# Patient Record
Sex: Male | Born: 1980 | Race: White | Hispanic: No | Marital: Married | Smoking: Current every day smoker
Health system: Southern US, Community
[De-identification: ages and names within clinical notes are randomized; demographics above are authoritative.]

## PROBLEM LIST (undated history)

## (undated) DIAGNOSIS — Z720 Tobacco use: Secondary | ICD-10-CM

## (undated) DIAGNOSIS — I10 Essential (primary) hypertension: Secondary | ICD-10-CM

## (undated) DIAGNOSIS — I2510 Atherosclerotic heart disease of native coronary artery without angina pectoris: Secondary | ICD-10-CM

## (undated) DIAGNOSIS — K219 Gastro-esophageal reflux disease without esophagitis: Secondary | ICD-10-CM

## (undated) DIAGNOSIS — E785 Hyperlipidemia, unspecified: Secondary | ICD-10-CM

## (undated) DIAGNOSIS — K5792 Diverticulitis of intestine, part unspecified, without perforation or abscess without bleeding: Secondary | ICD-10-CM

## (undated) HISTORY — DX: Hyperlipidemia, unspecified: E78.5

## (undated) HISTORY — DX: Tobacco use: Z72.0

## (undated) HISTORY — DX: Atherosclerotic heart disease of native coronary artery without angina pectoris: I25.10

---

## 2004-02-19 ENCOUNTER — Emergency Department: Payer: Self-pay | Admitting: Emergency Medicine

## 2004-05-22 ENCOUNTER — Emergency Department: Payer: Self-pay | Admitting: Emergency Medicine

## 2004-06-23 ENCOUNTER — Emergency Department: Payer: Self-pay | Admitting: Emergency Medicine

## 2006-09-15 ENCOUNTER — Emergency Department: Payer: Self-pay | Admitting: Emergency Medicine

## 2007-06-24 ENCOUNTER — Emergency Department: Payer: Self-pay | Admitting: Emergency Medicine

## 2007-06-24 ENCOUNTER — Other Ambulatory Visit: Payer: Self-pay

## 2007-08-26 ENCOUNTER — Emergency Department: Payer: Self-pay | Admitting: Emergency Medicine

## 2007-12-16 ENCOUNTER — Emergency Department: Payer: Self-pay | Admitting: Emergency Medicine

## 2007-12-26 ENCOUNTER — Emergency Department: Payer: Self-pay | Admitting: Emergency Medicine

## 2008-01-27 ENCOUNTER — Emergency Department: Payer: Self-pay | Admitting: Emergency Medicine

## 2008-04-01 ENCOUNTER — Emergency Department: Payer: Self-pay | Admitting: Emergency Medicine

## 2008-10-01 ENCOUNTER — Emergency Department: Payer: Self-pay | Admitting: Emergency Medicine

## 2009-01-09 ENCOUNTER — Emergency Department: Payer: Self-pay | Admitting: Unknown Physician Specialty

## 2009-04-06 ENCOUNTER — Emergency Department: Payer: Self-pay | Admitting: Emergency Medicine

## 2009-06-22 ENCOUNTER — Emergency Department: Payer: Self-pay | Admitting: Emergency Medicine

## 2009-07-05 ENCOUNTER — Emergency Department: Payer: Self-pay | Admitting: Emergency Medicine

## 2009-11-08 ENCOUNTER — Inpatient Hospital Stay: Payer: Self-pay | Admitting: Internal Medicine

## 2010-03-12 ENCOUNTER — Emergency Department: Payer: Self-pay | Admitting: Emergency Medicine

## 2010-03-28 ENCOUNTER — Emergency Department: Payer: Self-pay | Admitting: Emergency Medicine

## 2010-03-30 ENCOUNTER — Emergency Department: Payer: Self-pay | Admitting: Emergency Medicine

## 2010-04-07 ENCOUNTER — Emergency Department: Payer: Self-pay | Admitting: Emergency Medicine

## 2010-04-09 ENCOUNTER — Emergency Department: Payer: Self-pay | Admitting: Emergency Medicine

## 2010-06-06 ENCOUNTER — Emergency Department: Payer: Self-pay | Admitting: Emergency Medicine

## 2010-06-12 ENCOUNTER — Emergency Department: Payer: Self-pay | Admitting: Emergency Medicine

## 2010-08-20 ENCOUNTER — Emergency Department: Payer: Self-pay | Admitting: Internal Medicine

## 2010-08-27 ENCOUNTER — Emergency Department: Payer: Self-pay | Admitting: Emergency Medicine

## 2010-11-07 ENCOUNTER — Emergency Department: Payer: Self-pay | Admitting: Emergency Medicine

## 2011-03-26 ENCOUNTER — Emergency Department: Payer: Self-pay

## 2011-05-07 ENCOUNTER — Emergency Department: Payer: Self-pay | Admitting: Emergency Medicine

## 2011-06-10 ENCOUNTER — Emergency Department: Payer: Self-pay | Admitting: Emergency Medicine

## 2011-08-23 ENCOUNTER — Emergency Department: Payer: Self-pay | Admitting: *Deleted

## 2011-12-13 ENCOUNTER — Emergency Department: Payer: Self-pay

## 2011-12-13 LAB — CBC
HCT: 49.1 % (ref 40.0–52.0)
HGB: 17.2 g/dL (ref 13.0–18.0)
MCH: 32.9 pg (ref 26.0–34.0)
MCV: 94 fL (ref 80–100)
Platelet: 200 10*3/uL (ref 150–440)
RBC: 5.22 10*6/uL (ref 4.40–5.90)
WBC: 9 10*3/uL (ref 3.8–10.6)

## 2011-12-13 LAB — BASIC METABOLIC PANEL
Calcium, Total: 9.2 mg/dL (ref 8.5–10.1)
Chloride: 105 mmol/L (ref 98–107)
Co2: 30 mmol/L (ref 21–32)
Creatinine: 0.92 mg/dL (ref 0.60–1.30)
Glucose: 76 mg/dL (ref 65–99)
Potassium: 4.2 mmol/L (ref 3.5–5.1)
Sodium: 143 mmol/L (ref 136–145)

## 2011-12-13 LAB — URINALYSIS, COMPLETE
Bacteria: NONE SEEN
Bilirubin,UR: NEGATIVE
Blood: NEGATIVE
Glucose,UR: NEGATIVE mg/dL (ref 0–75)
Leukocyte Esterase: NEGATIVE
Protein: NEGATIVE
RBC,UR: 1 /HPF (ref 0–5)
Specific Gravity: 1.024 (ref 1.003–1.030)
Squamous Epithelial: NONE SEEN
WBC UR: <1 /HPF (ref 0–5)

## 2011-12-17 ENCOUNTER — Emergency Department: Payer: Self-pay

## 2011-12-17 LAB — COMPREHENSIVE METABOLIC PANEL
Albumin: 4 g/dL (ref 3.4–5.0)
Anion Gap: 7 (ref 7–16)
Bilirubin,Total: 0.3 mg/dL (ref 0.2–1.0)
Calcium, Total: 9 mg/dL (ref 8.5–10.1)
Co2: 27 mmol/L (ref 21–32)
Creatinine: 1 mg/dL (ref 0.60–1.30)
EGFR (African American): >60
EGFR (Non-African Amer.): >60
Glucose: 93 mg/dL (ref 65–99)
Osmolality: 274 (ref 275–301)
Potassium: 4 mmol/L (ref 3.5–5.1)
SGOT(AST): 65 U/L — ABNORMAL HIGH (ref 15–37)
Sodium: 138 mmol/L (ref 136–145)

## 2011-12-17 LAB — URINALYSIS, COMPLETE
Bacteria: NONE SEEN
Bilirubin,UR: NEGATIVE
Blood: NEGATIVE
Nitrite: NEGATIVE
Protein: NEGATIVE
Specific Gravity: 1.012 (ref 1.003–1.030)

## 2011-12-17 LAB — CBC
HCT: 50.8 % (ref 40.0–52.0)
HGB: 17.8 g/dL (ref 13.0–18.0)
MCHC: 35 g/dL (ref 32.0–36.0)
MCV: 93 fL (ref 80–100)
RBC: 5.44 10*6/uL (ref 4.40–5.90)
WBC: 10 10*3/uL (ref 3.8–10.6)

## 2011-12-17 LAB — LIPASE, BLOOD: Lipase: 2245 U/L — ABNORMAL HIGH (ref 73–393)

## 2012-05-28 ENCOUNTER — Emergency Department: Payer: Self-pay | Admitting: Emergency Medicine

## 2013-01-18 ENCOUNTER — Emergency Department: Payer: Self-pay | Admitting: Emergency Medicine

## 2013-02-11 ENCOUNTER — Emergency Department: Payer: Self-pay | Admitting: Emergency Medicine

## 2013-02-21 ENCOUNTER — Emergency Department: Payer: Self-pay | Admitting: Emergency Medicine

## 2013-07-28 ENCOUNTER — Emergency Department: Payer: Self-pay | Admitting: Emergency Medicine

## 2013-09-08 ENCOUNTER — Emergency Department: Payer: Self-pay | Admitting: Internal Medicine

## 2013-10-12 ENCOUNTER — Emergency Department: Payer: Self-pay

## 2013-11-22 ENCOUNTER — Emergency Department: Payer: Self-pay | Admitting: Emergency Medicine

## 2013-12-09 ENCOUNTER — Emergency Department: Payer: Self-pay | Admitting: Emergency Medicine

## 2014-11-17 ENCOUNTER — Emergency Department
Admission: EM | Admit: 2014-11-17 | Discharge: 2014-11-17 | Disposition: A | Discharge status: Home / Self Care | Payer: Self-pay | Attending: Student | Admitting: Student

## 2014-11-17 DIAGNOSIS — H6500 Acute serous otitis media, unspecified ear: Secondary | ICD-10-CM

## 2014-11-17 DIAGNOSIS — J0100 Acute maxillary sinusitis, unspecified: Secondary | ICD-10-CM | POA: Insufficient documentation

## 2014-11-17 DIAGNOSIS — H6503 Acute serous otitis media, bilateral: Secondary | ICD-10-CM | POA: Insufficient documentation

## 2014-11-17 MED ORDER — AMOXICILLIN 875 MG PO TABS: 875.0000 mg | ORAL_TABLET | Freq: Two times a day (BID) | ORAL | Status: DC

## 2014-11-17 MED ORDER — AMOXICILLIN 500 MG PO CAPS
1000.0000 mg | ORAL_CAPSULE | Freq: Once | ORAL | Status: AC
  Administered 2014-11-17: 1000 mg via ORAL

## 2014-11-17 MED ORDER — AMOXICILLIN 500 MG PO CAPS
ORAL_CAPSULE | ORAL | Status: AC
  Administered 2014-11-17: 1000 mg via ORAL
  Filled 2014-11-17: qty 2

## 2014-11-17 NOTE — ED Notes (Signed)
Pt. States chronic issues with ears due to sinus problems yearly.  Pt. States ear drops have been ineffective with helping with ear pain.

## 2014-11-17 NOTE — Discharge Instructions (Signed)
Otitis Media With Effusion Otitis media with effusion is the presence of fluid in the middle ear. This is a common problem in children, which often follows ear infections. It may be present for weeks or longer after the infection. Unlike an acute ear infection, otitis media with effusion refers only to fluid behind the ear drum and not infection. Children with repeated ear and sinus infections and allergy problems are the most likely to get otitis media with effusion. CAUSES  The most frequent cause of the fluid buildup is dysfunction of the eustachian tubes. These are the tubes that drain fluid in the ears to the back of the nose (nasopharynx). SYMPTOMS   The main symptom of this condition is hearing loss. As a result, you or your child may:  Listen to the TV at a loud volume.  Not respond to questions.  Ask "what" often when spoken to.  Mistake or confuse one sound or word for another.  There may be a sensation of fullness or pressure but usually not pain. DIAGNOSIS   Your health care provider will diagnose this condition by examining you or your child's ears.  Your health care provider may test the pressure in you or your child's ear with a tympanometer.  A hearing test may be conducted if the problem persists. TREATMENT   Treatment depends on the duration and the effects of the effusion.  Antibiotics, decongestants, nose drops, and cortisone-type drugs (tablets or nasal spray) may not be helpful.  Children with persistent ear effusions may have delayed language or behavioral problems. Children at risk for developmental delays in hearing, learning, and speech may require referral to a specialist earlier than children not at risk.  You or your child's health care provider may suggest a referral to an ear, nose, and throat surgeon for treatment. The following may help restore normal hearing:  Drainage of fluid.  Placement of ear tubes (tympanostomy tubes).  Removal of adenoids  (adenoidectomy). HOME CARE INSTRUCTIONS   Avoid secondhand smoke.  Infants who are breastfed are less likely to have this condition.  Avoid feeding infants while they are lying flat.  Avoid known environmental allergens.  Avoid people who are sick. SEEK MEDICAL CARE IF:   Hearing is not better in 3 months.  Hearing is worse.  Ear pain.  Drainage from the ear.  Dizziness. MAKE SURE YOU:   Understand these instructions.  Will watch your condition.  Will get help right away if you are not doing well or get worse. Document Released: 06/12/2004 Document Revised: 09/19/2013 Document Reviewed: 11/30/2012 ExitCare Patient Information 2015 [employer]. This information is not intended to replace advice given to you by your health care provider. Make sure you discuss any questions you have with your health care provider. Sinusitis Sinusitis is redness, soreness, and inflammation of the paranasal sinuses. Paranasal sinuses are air pockets within the bones of your face (beneath the eyes, the middle of the forehead, or above the eyes). In healthy paranasal sinuses, mucus is able to drain out, and air is able to circulate through them by way of your nose. However, when your paranasal sinuses are inflamed, mucus and air can become trapped. This can allow bacteria and other germs to grow and cause infection. Sinusitis can develop quickly and last only a short time (acute) or continue over a long period (chronic). Sinusitis that lasts for more than 12 weeks is considered chronic.  CAUSES  Causes of sinusitis include:  Allergies.  Structural abnormalities, such as   displacement of the cartilage that separates your nostrils (deviated septum), which can decrease the air flow through your nose and sinuses and affect sinus drainage.  Functional abnormalities, such as when the small hairs (cilia) that line your sinuses and help remove mucus do not work properly or are not present. SIGNS AND  SYMPTOMS  Symptoms of acute and chronic sinusitis are the same. The primary symptoms are pain and pressure around the affected sinuses. Other symptoms include:  Upper toothache.  Earache.  Headache.  Bad breath.  Decreased sense of smell and taste.  A cough, which worsens when you are lying flat.  Fatigue.  Fever.  Thick drainage from your nose, which often is green and may contain pus (purulent).  Swelling and warmth over the affected sinuses. DIAGNOSIS  Your health care provider will perform a physical exam. During the exam, your health care provider may:  Look in your nose for signs of abnormal growths in your nostrils (nasal polyps).  Tap over the affected sinus to check for signs of infection.  View the inside of your sinuses (endoscopy) using an imaging device that has a light attached (endoscope). If your health care provider suspects that you have chronic sinusitis, one or more of the following tests may be recommended:  Allergy tests.  Nasal culture. A sample of mucus is taken from your nose, sent to a lab, and screened for bacteria.  Nasal cytology. A sample of mucus is taken from your nose and examined by your health care provider to determine if your sinusitis is related to an allergy. TREATMENT  Most cases of acute sinusitis are related to a viral infection and will resolve on their own within 10 days. Sometimes medicines are prescribed to help relieve symptoms (pain medicine, decongestants, nasal steroid sprays, or saline sprays).  However, for sinusitis related to a bacterial infection, your health care provider will prescribe antibiotic medicines. These are medicines that will help kill the bacteria causing the infection.  Rarely, sinusitis is caused by a fungal infection. In theses cases, your health care provider will prescribe antifungal medicine. For some cases of chronic sinusitis, surgery is needed. Generally, these are cases in which sinusitis recurs  more than 3 times per year, despite other treatments. HOME CARE INSTRUCTIONS   Drink plenty of water. Water helps thin the mucus so your sinuses can drain more easily.  Use a humidifier.  Inhale steam 3 to 4 times a day (for example, sit in the bathroom with the shower running).  Apply a warm, moist washcloth to your face 3 to 4 times a day, or as directed by your health care provider.  Use saline nasal sprays to help moisten and clean your sinuses.  Take medicines only as directed by your health care provider.  If you were prescribed either an antibiotic or antifungal medicine, finish it all even if you start to feel better. SEEK IMMEDIATE MEDICAL CARE IF:  You have increasing pain or severe headaches.  You have nausea, vomiting, or drowsiness.  You have swelling around your face.  You have vision problems.  You have a stiff neck.  You have difficulty breathing. MAKE SURE YOU:   Understand these instructions.  Will watch your condition.  Will get help right away if you are not doing well or get worse. Document Released: 05/05/2005 Document Revised: 09/19/2013 Document Reviewed: 05/20/2011 ExitCare Patient Information 2015 [employer]. This information is not intended to replace advice given to you by your health care provider. Make sure   you discuss any questions you have with your health care provider.  

## 2014-11-17 NOTE — ED Notes (Signed)
Pt reports started about 1 week ago with sinus congestion. Since has been having intermittent pain to both ear but left worse than the right. Pt also reports he has a ringing in his ears and having difficulty hearing. Pt talking in full, complete sentences with no difficulty at this time.

## 2014-11-17 NOTE — ED Provider Notes (Signed)
CSN: [id number]     Arrival date & time 11/17/14  1920 History   First MD Initiated Contact with Patient 11/17/14 2002     Chief Complaint  Patient presents with  . Otalgia     (Consider location/radiation/quality/duration/timing/severity/associated sxs/prior Treatment) HPI  34 year old male with one-week history of maxillary sinus pain and pressure. He has a history of sinus infections yearly. Over the last week he's had increased congestion, nasal drainage, sinus pressure and headaches. Eyes any fevers chest pain shortness of breath. He has had increased pressure in bilateral ears with decreased hearing. He has not been taking any medications for congestion.   No past medical history on file. No past surgical history on file. No family history on file. History  Substance Use Topics  . Smoking status: Not on file  . Smokeless tobacco: Not on file  . Alcohol Use: Not on file    Review of Systems  Constitutional: Negative.  Negative for fever, chills, activity change and appetite change.  HENT: Positive for congestion, ear pain and sinus pressure. Negative for mouth sores, rhinorrhea, sore throat and trouble swallowing.   Eyes: Negative for photophobia, pain and discharge.  Respiratory: Negative for cough, chest tightness and shortness of breath.   Cardiovascular: Negative for chest pain and leg swelling.  Gastrointestinal: Negative for nausea, vomiting, abdominal pain, diarrhea and abdominal distention.  Genitourinary: Negative for dysuria and difficulty urinating.  Musculoskeletal: Negative for back pain, arthralgias and gait problem.  Skin: Negative for color change and rash.  Neurological: Negative for dizziness and headaches.  Hematological: Negative for adenopathy.  Psychiatric/Behavioral: Negative for behavioral problems and agitation.      Allergies  Review of patient's allergies indicates no known allergies.  Home Medications   Prior to Admission medications    Medication Sig Start Date End Date Taking? Authorizing Provider  amoxicillin (AMOXIL) 875 MG tablet Take 1 tablet (875 mg total) by mouth 2 (two) times daily. X 10 days 11/17/14   Evon Slackhomas C Joson Sapp, PA-C   BP 134/93 mmHg  Pulse 87  Temp(Src) 98.5 F (36.9 C) (Oral)  Resp 18  Ht 6\' 2"  (1.88 m)  Wt 220 lb (99.791 kg)  BMI 28.23 kg/m2  SpO2 98% Physical Exam  Constitutional: He is oriented to person, place, and time. He appears well-developed and well-nourished.  HENT:  Head: Normocephalic and atraumatic.  Right Ear: Hearing, external ear and ear canal normal. A middle ear effusion (Without erythema) is present.  Left Ear: Hearing, external ear and ear canal normal. Left ear middle ear effusion: without erythema. No decreased hearing is noted.  Nose: Nose normal.  Tenderness to palpation and percussion over the frontal and maxillary sinus.  Eyes: Conjunctivae and EOM are normal. Pupils are equal, round, and reactive to light.  Neck: Normal range of motion. Neck supple.  Cardiovascular: Normal rate, regular rhythm, normal heart sounds and intact distal pulses.   Pulmonary/Chest: Effort normal and breath sounds normal. No respiratory distress. He has no wheezes. He has no rales. He exhibits no tenderness.  Abdominal: Soft. Bowel sounds are normal. He exhibits no distension. There is no tenderness.  Musculoskeletal: Normal range of motion. He exhibits no edema or tenderness.  Neurological: He is alert and oriented to person, place, and time.  Skin: Skin is warm and dry.  Psychiatric: He has a normal mood and affect. His behavior is normal. Judgment and thought content normal.    ED Course  Procedures (including critical care time) Labs Review Labs Reviewed -  No data to display  Imaging Review No results found.   EKG Interpretation None      MDM   Final diagnoses:  Acute maxillary sinusitis, recurrence not specified  Acute serous otitis media, recurrence not specified,  bilateral    34 year old male with seven-day history of congestion with sinus pain and pressure. Has noticed increased pressure in bilateral ears last few days. Patient has not been taking any medications. Will treat patient for sinus infection with amoxicillin. Patient was told to take over-the-counter decongestion medications and increase fluids.    Evon Slack, PA-C 11/17/14 2016  Gayla Doss, MD 11/18/14 [phone]

## 2014-11-30 ENCOUNTER — Encounter: Payer: Self-pay | Admitting: Urgent Care

## 2014-11-30 ENCOUNTER — Emergency Department
Admission: EM | Admit: 2014-11-30 | Discharge: 2014-11-30 | Disposition: A | Discharge status: Home / Self Care | Payer: Self-pay | Attending: Emergency Medicine | Admitting: Emergency Medicine

## 2014-11-30 DIAGNOSIS — L259 Unspecified contact dermatitis, unspecified cause: Secondary | ICD-10-CM | POA: Insufficient documentation

## 2014-11-30 DIAGNOSIS — H9201 Otalgia, right ear: Secondary | ICD-10-CM | POA: Insufficient documentation

## 2014-11-30 NOTE — Discharge Instructions (Signed)
We discussed your ear examination shows mild redness without signs of infection. The rash in her legs looks like it might be poison ivy or another type of contact dermatitis. Take over-the-counter Benadryl 25-50 mg every 4 hours as needed for itching. Return to the emergency department for any new or worsening condition including worsening rash, fever, drainage, or any worsening problem with your ear such as ear pain, or loss of hearing.   Contact Dermatitis Contact dermatitis is a rash that happens when something touches the skin. You touched something that irritates your skin, or you have allergies to something you touched. HOME CARE   Avoid the thing that caused your rash.  Keep your rash away from hot water, soap, sunlight, chemicals, and other things that might bother it.  Do not scratch your rash.  You can take cool baths to help stop itching.  Only take medicine as told by your doctor.  Keep all doctor visits as told. GET HELP RIGHT AWAY IF:   Your rash is not better after 3 days.  Your rash gets worse.  Your rash is puffy (swollen), tender, red, sore, or warm.  You have problems with your medicine. MAKE SURE YOU:  Understand these instructions.  Poison [employer] ivy is a inflammation of the skin (contact dermatitis) caused by touching the allergens on the leaves of the ivy plant following previous exposure to the plant. The rash usually appears 48 hours after exposure. The rash is usually bumps (papules) or blisters (vesicles) in a linear pattern. Depending on your own sensitivity, the rash may simply cause redness and itching, or it may also progress to blisters which may break open. These must be well cared for to prevent secondary bacterial (germ) infection, followed by scarring. Keep any open areas dry, clean, dressed, and covered with an antibacterial ointment if needed. The eyes may also get puffy. The puffiness is worst in the morning and gets better as the day  progresses. This dermatitis usually heals without scarring, within 2 to 3 weeks without treatment. HOME CARE INSTRUCTIONS  Thoroughly wash with soap and water as soon as you have been exposed to poison ivy. You have about one half hour to remove the plant resin before it will cause the rash. This washing will destroy the oil or antigen on the skin that is causing, or will cause, the rash. Be sure to wash under your fingernails as any plant resin there will continue to spread the rash. Do not rub skin vigorously when washing affected area. Poison ivy cannot spread if no oil from the plant remains on your body. A rash that has progressed to weeping sores will not spread the rash unless you have not washed thoroughly. It is also important to wash any clothes you have been wearing as these may carry active allergens. The rash will return if you wear the unwashed clothing, even several days later. Avoidance of the plant in the future is the best measure. Poison ivy plant can be recognized by the number of leaves. Generally, poison ivy has three leaves with flowering branches on a single stem. Diphenhydramine may be purchased over the counter and used as needed for itching. Do not drive with this medication if it makes you drowsy.Ask your caregiver about medication for children. SEEK MEDICAL CARE IF:  Open sores develop.  Redness spreads beyond area of rash.  You notice purulent (pus-like) discharge.  You have increased pain.  Other signs of infection develop (such as fever). Document Released:  05/02/2000 Document Revised: 07/28/2011 Document Reviewed: 10/13/2008 ExitCare Patient Information 2015 [place] [place]. This information is not intended to replace advice given to you by your health care provider. Make sure you discuss any questions you have with your health care provider.    Will watch your condition.  Will get help right away if you are not doing well or get worse. Document Released:  03/02/2009 Document Revised: 07/28/2011 Document Reviewed: 10/08/2010 [facility] Patient Information 2015 [place], [place]. This information is not intended to replace advice given to you by your health care provider. Make sure you discuss any questions you have with your health care provider.

## 2014-11-30 NOTE — ED Provider Notes (Signed)
[facility]   ____________________________________________  Time seen: 6:45 AM I have reviewed the triage vital signs and the triage nursing note.  HISTORY  Chief Complaint Ear Fullness and Rash   Historian Patient  HPI Jim [name] is a 34 y.o. male who is here for evaluation of a "fullness" in his right ear. He was recently treated with amoxicillin for an ear infection which was mostly in the left ear. The left ear feels cleared up. He is not a fever. He is having minimal to no cough. Does not report seasonal allergies.  The main reason he came in was for evaluation of skin rash to his bilateral lower extremity. He states he was weed eating and might of gotten into poison ivy. He has washed his legs with Clorox. The rash is itchy and a little bit weeping.    History reviewed. No pertinent past medical history. recent left otitis media  There are no active problems to display for this patient.   History reviewed. No pertinent past surgical history.  Current Outpatient Rx  Name  Route  Sig  Dispense  Refill  . amoxicillin (AMOXIL) 875 MG tablet   Oral   Take 1 tablet (875 mg total) by mouth 2 (two) times daily. X 10 days   20 tablet   0     Allergies Review of patient's allergies indicates no known allergies.  No family history on file.  Social History History  Substance Use Topics  . Smoking status: Never Smoker   . Smokeless tobacco: Not on file  . Alcohol Use: No    Review of Systems  Constitutional: Negative for fever. Eyes: Negative for visual changes. ENT: Negative for sore throat. Cardiovascular: Negative for chest pain. Respiratory: Negative for shortness of breath. Gastrointestinal: Negative for abdominal pain, vomiting and diarrhea. Genitourinary: Negative for dysuria. Musculoskeletal: Negative for back pain. Skin: Negative for rash. Neurological: Negative for headaches, focal  weakness or numbness. 10 point Review of Systems otherwise negative ____________________________________________   PHYSICAL EXAM:  VITAL SIGNS: ED Triage Vitals  Enc Vitals Group     BP 11/30/14 0517 147/100 mmHg     Pulse Rate 11/30/14 0517 92     Resp 11/30/14 0517 18     Temp 11/30/14 0517 98.6 F (37 C)     Temp Source 11/30/14 0517 Oral     SpO2 11/30/14 0517 100 %     Weight 11/30/14 0517 220 lb (99.791 kg)     Height 11/30/14 0517 6\' 2"  (1.88 m)     Head Cir --      Peak Flow --      Pain Score 11/30/14 0517 0     Pain Loc --      Pain Edu? --      Excl. in GC? --      Constitutional: Alert and oriented. Well appearing and in no distress. Eyes: Conjunctivae are normal. PERRL. Normal extraocular movements. ENT: left TM normal. Right TM mild erythema at the lower margin, but no bulging, fluid, or dullness   Head: Normocephalic and atraumatic.   Nose: No congestion/rhinnorhea.   Mouth/Throat: Mucous membranes are moist.   Neck: No stridor. Cardiovascular/Chest: Normal rate, regular rhythm.  No murmurs, rubs, or gallops. Respiratory: Normal respiratory effort without tachypnea nor retractions. Breath sounds are clear and equal bilaterally. No wheezes/rales/rhonchi. Gastrointestinal:  Genitourinary/rectal:Deferred Musculoskeletal: Nontender with normal range of motion in all extremities. No joint effusions.  No lower extremity  tenderness nor edema. Neurologic:  Normal speech and language. No gross or focal neurologic deficits are appreciated. Skin:  Skin is warm.  Somewhat linear appearing mildly weeping rash to the fronts of both shins.   ____________________________________________   EKG I, Governor Rooks, MD, the attending physician have personally viewed and interpreted all ECGs.  None ____________________________________________  LABS (pertinent positives/negatives)  None  ____________________________________________  RADIOLOGY All Xrays  were viewed by me. Imaging interpreted by Radiologist.  None __________________________________________  PROCEDURES  Procedure(s) performed: None Critical Care performed: None  ____________________________________________   ED COURSE / ASSESSMENT AND PLAN  CONSULTATIONS: None  Pertinent labs & imaging results that were available during my care of the patient were reviewed by me and considered in my medical decision making (see chart for details).   The patient's lower extremity rash appears to be a contact dermatitis/poison ivy. The case is overall mild. The patient has not tried Benadryl. He is driving home, so I'm not having a Benadryl here. He is given a pick it up over-the-counter. At this point and will think he needs systemic steroids.  His right ear shows no acute otitis media. This may be pressure/sequelae of the recent ear infection. I've asked him to wait and see how this goes. It lasted to follow-up with his primary care physician.  Patient / Family / Caregiver informed of clinical course, medical decision-making process, and agree with plan.   I discussed return precautions, follow-up instructions, and discharged instructions with patient and/or family.  ___________________________________________   FINAL CLINICAL IMPRESSION(S) / ED DIAGNOSES   Final diagnoses:  Contact dermatitis  Otalgia of right ear    FOLLOW UP  Referred to: Primary care at [facility].   Governor Rooks, MD 11/30/14 [phone]

## 2014-11-30 NOTE — ED Notes (Addendum)
Patient presents with non-specific rash to BLE x 2 days; thinks he may have gotten into poison oak. Patient also with c/o fullness in his ears - was here x 2 weeks ago and Rx'd Amoxicillin. Of note, patient wanting to make sure MD was aware that he had been bathing in Clorox without achieving relief of his itching.

## 2015-07-05 ENCOUNTER — Emergency Department
Admission: EM | Admit: 2015-07-05 | Discharge: 2015-07-05 | Disposition: A | Discharge status: Home / Self Care | Payer: Self-pay | Attending: Emergency Medicine | Admitting: Emergency Medicine

## 2015-07-05 ENCOUNTER — Encounter: Payer: Self-pay | Admitting: Medical Oncology

## 2015-07-05 DIAGNOSIS — F17200 Nicotine dependence, unspecified, uncomplicated: Secondary | ICD-10-CM | POA: Insufficient documentation

## 2015-07-05 DIAGNOSIS — K0889 Other specified disorders of teeth and supporting structures: Secondary | ICD-10-CM | POA: Insufficient documentation

## 2015-07-05 DIAGNOSIS — I10 Essential (primary) hypertension: Secondary | ICD-10-CM | POA: Insufficient documentation

## 2015-07-05 DIAGNOSIS — Z79899 Other long term (current) drug therapy: Secondary | ICD-10-CM | POA: Insufficient documentation

## 2015-07-05 HISTORY — DX: Essential (primary) hypertension: I10

## 2015-07-05 HISTORY — DX: Gastro-esophageal reflux disease without esophagitis: K21.9

## 2015-07-05 MED ORDER — IBUPROFEN 800 MG PO TABS: 800.0000 mg | ORAL_TABLET | Freq: Three times a day (TID) | ORAL | Status: DC

## 2015-07-05 MED ORDER — AMOXICILLIN 500 MG PO CAPS: 500.0000 mg | ORAL_CAPSULE | Freq: Three times a day (TID) | ORAL | Status: DC

## 2015-07-05 NOTE — ED Provider Notes (Signed)
[facility] Emergency Department Provider Note  ____________________________________________  Time seen: Approximately 9:14 AM  I have reviewed the triage vital signs and the nursing notes.   HISTORY  Chief Complaint Dental Pain   HPI Jim [name] is a 35 y.o. male still complained of left upper dental pain for 2 days. Patient states that he has not seen a dentist and continues to have dental pain. He denies any fever or chills. He has decreased appetite secondary to pain. His also decreased his amount of smoking but has not completely stopped. He has been taking some over-the-counter medication with minimal relief. Currently his pain is 10 over 10.   Past Medical History  Diagnosis Date  . Hypertension   . GERD (gastroesophageal reflux disease)     There are no active problems to display for this patient.   History reviewed. No pertinent past surgical history.  Current Outpatient Rx  Name  Route  Sig  Dispense  Refill  . esomeprazole (NEXIUM) 40 MG capsule   Oral   Take 40 mg by mouth daily at 12 noon.         Marland Kitchen amoxicillin (AMOXIL) 500 MG capsule   Oral   Take 1 capsule (500 mg total) by mouth 3 (three) times daily.   30 capsule   0   . ibuprofen (ADVIL,MOTRIN) 800 MG tablet   Oral   Take 1 tablet (800 mg total) by mouth 3 (three) times daily.   30 tablet   0     Allergies Review of patient's allergies indicates no known allergies.  No family history on file.  Social History Social History  Substance Use Topics  . Smoking status: Current Every Day Smoker  . Smokeless tobacco: None  . Alcohol Use: No    Review of Systems Constitutional: No fever/chills ENT: No sore throat. Positive dental pain Cardiovascular: Denies chest pain. Respiratory: Denies shortness of breath. Gastrointestinal:  No nausea, no vomiting.  Musculoskeletal: Negative for back pain. Skin: Negative for rash. Neurological: Negative for headaches, focal  weakness or numbness.  10-point ROS otherwise negative.  ____________________________________________   PHYSICAL EXAM:  VITAL SIGNS: ED Triage Vitals  Enc Vitals Group     BP 07/05/15 0853 172/102 mmHg     Pulse Rate 07/05/15 0853 88     Resp 07/05/15 0853 18     Temp 07/05/15 0853 97.5 F (36.4 C)     Temp Source 07/05/15 0853 Oral     SpO2 07/05/15 0853 100 %     Weight 07/05/15 0853 220 lb (99.791 kg)     Height 07/05/15 0853  (1.88 m)     Head Cir --      Peak Flow --      Pain Score 07/05/15 0854 10     Pain Loc --      Pain Edu? --      Excl. in GC? --     Constitutional: Alert and oriented. Well appearing and in no acute distress. Eyes: Conjunctivae are normal. PERRL. EOMI. Head: Atraumatic. Nose: No congestion/rhinnorhea. Mouth/Throat: Mucous membranes are moist.  Oropharynx non-erythematous. Left upper molars poor dental repair and very poor hygiene. There is moderate gum tenderness and edema but no obvious abscess seen. Neck: No stridor.  Supple. Hematological/Lymphatic/Immunilogical: No cervical lymphadenopathy. Cardiovascular: Normal rate, regular rhythm. Grossly normal heart sounds.  Good peripheral circulation. Respiratory: Normal respiratory effort.  No retractions. Lungs CTAB. [facility] without any difficulty. Normal  gait was noted. Neurologic:  Normal speech and language. No gross focal neurologic deficits are appreciated. No gait instability. Skin:  Skin is warm, dry and intact. No rash noted. Psychiatric: Mood and affect are normal. Speech and behavior are normal.  ____________________________________________   LABS (all labs ordered are listed, but only abnormal results are displayed)  Labs Reviewed - No data to display    PROCEDURES  Procedure(s) performed: None  Critical Care performed: No  ____________________________________________   INITIAL IMPRESSION / ASSESSMENT AND PLAN / ED  COURSE  Pertinent labs & imaging results that were available during my care of the patient were reviewed by me and considered in my medical decision making (see chart for details).  Patient was given prescription for amoxicillin 500 mg 3 times a day for 10 days along with ibuprofen 800 mg 3 times a day with food. Patient was given a list of all dental clinics in this area and he is encouraged to make an appointment. ____________________________________________   FINAL CLINICAL IMPRESSION(S) / ED DIAGNOSES  Final diagnoses:  Pain, dental      Tommi Rumps, PA-C 07/05/15 1250  Emily Filbert, MD 07/05/15 [phone]

## 2015-07-05 NOTE — Discharge Instructions (Signed)
Follow-up with dentist that is on the list given daily. Call and make an appointment. Begin taking amoxicillin 500 mg 3 times a day until completely finished. Ibuprofen as needed for pain and inflammation. You may also take Tylenol in between if needed for pain.

## 2015-07-05 NOTE — ED Notes (Signed)
Pt c/o left sided dental pain x2 days 

## 2015-10-10 ENCOUNTER — Emergency Department
Admission: EM | Admit: 2015-10-10 | Discharge: 2015-10-10 | Disposition: A | Discharge status: Home / Self Care | Payer: Self-pay | Attending: Emergency Medicine | Admitting: Emergency Medicine

## 2015-10-10 ENCOUNTER — Encounter: Payer: Self-pay | Admitting: Emergency Medicine

## 2015-10-10 DIAGNOSIS — F17200 Nicotine dependence, unspecified, uncomplicated: Secondary | ICD-10-CM | POA: Insufficient documentation

## 2015-10-10 DIAGNOSIS — Z8719 Personal history of other diseases of the digestive system: Secondary | ICD-10-CM | POA: Insufficient documentation

## 2015-10-10 DIAGNOSIS — K047 Periapical abscess without sinus: Secondary | ICD-10-CM | POA: Insufficient documentation

## 2015-10-10 DIAGNOSIS — I10 Essential (primary) hypertension: Secondary | ICD-10-CM | POA: Insufficient documentation

## 2015-10-10 MED ORDER — AMOXICILLIN 500 MG PO TABS: 500.0000 mg | ORAL_TABLET | Freq: Two times a day (BID) | ORAL | Status: DC

## 2015-10-10 MED ORDER — IBUPROFEN 800 MG PO TABS: 800.0000 mg | ORAL_TABLET | Freq: Three times a day (TID) | ORAL | Status: DC | PRN

## 2015-10-10 MED ORDER — OXYCODONE-ACETAMINOPHEN 5-325 MG PO TABS: 1.0000 | ORAL_TABLET | ORAL | Status: DC | PRN

## 2015-10-10 NOTE — ED Provider Notes (Signed)
[facility] Emergency Department Provider Note  ____________________________________________  Time seen: Approximately 1:24 PM  I have reviewed the triage vital signs and the nursing notes.   HISTORY  Chief Complaint Dental Pain    HPI Jim [name] is a 35 y.o. male who presents for evaluation of left-sided dental pain and abscess 1 month getting progressively worse. Patient states that he cannot see a dentist until early abscess causing weight. Rates his pain as 10 over 10 nonradiating no relief with over-the-counter medications.   Past Medical History  Diagnosis Date  . Hypertension   . GERD (gastroesophageal reflux disease)     There are no active problems to display for this patient.   History reviewed. No pertinent past surgical history.  Current Outpatient Rx  Name  Route  Sig  Dispense  Refill  . amoxicillin (AMOXIL) 500 MG tablet   Oral   Take 1 tablet (500 mg total) by mouth 2 (two) times daily.   20 tablet   0   . ibuprofen (ADVIL,MOTRIN) 800 MG tablet   Oral   Take 1 tablet (800 mg total) by mouth every 8 (eight) hours as needed.   30 tablet   0   . oxyCODONE-acetaminophen (ROXICET) 5-325 MG tablet   Oral   Take 1-2 tablets by mouth every 4 (four) hours as needed for severe pain.   15 tablet   0     Allergies Review of patient's allergies indicates no known allergies.  No family history on file.  Social History Social History  Substance Use Topics  . Smoking status: Current Every Day Smoker  . Smokeless tobacco: None  . Alcohol Use: No    Review of Systems Constitutional: No fever/chills Eyes: No visual changes. ENT: Positive for dental pain. Musculoskeletal: Negative for back pain. Skin: Negative for rash. Neurological: Negative for headaches, focal weakness or numbness.  10-point ROS otherwise negative.  ____________________________________________   PHYSICAL EXAM:  VITAL SIGNS: ED Triage Vitals   Enc Vitals Group     BP 10/10/15 1236 165/104 mmHg     Pulse Rate 10/10/15 1236 74     Resp 10/10/15 1236 20     Temp 10/10/15 1236 97.6 F (36.4 C)     Temp Source 10/10/15 1236 Oral     SpO2 10/10/15 1236 98 %     Weight 10/10/15 1236 210 lb (95.255 kg)     Height 10/10/15 1236 6' (1.829 m)     Head Cir --      Peak Flow --      Pain Score 10/10/15 1236 10     Pain Loc --      Pain Edu? --      Excl. in GC? --     Constitutional: Alert and oriented. Well appearing and in no acute distress. Mouth/Throat: Mucous membranes are moist.  Oropharynx non-erythematous.Obvious dental caries with left-sided facial swelling noted. Gums are swollen. Neck: No stridor. Full range of motion nontender no cervical adenopathy. Cardiovascular: Normal rate, regular rhythm. Grossly normal heart sounds.  Good peripheral circulation. Respiratory: Normal respiratory effort.  No retractions. Lungs CTAB. Skin:  Skin is warm, dry and intact. No rash noted. Psychiatric: Mood and affect are normal. Speech and behavior are normal.  ____________________________________________   LABS (all labs ordered are listed, but only abnormal results are displayed)  Labs Reviewed - No data to display   PROCEDURES  Procedure(s) performed: None  Critical Care performed: No  ____________________________________________   INITIAL  IMPRESSION / ASSESSMENT AND PLAN / ED COURSE  Pertinent labs & imaging results that were available during my care of the patient were reviewed by me and considered in my medical decision making (see chart for details).  Acute dental abscess. Rx given for Percocet 5/325, ibuprofen 800 and amoxicillin 500. A list of dental providers was given to the patient. He is to follow-up with him as soon as possible. ____________________________________________   FINAL CLINICAL IMPRESSION(S) / ED DIAGNOSES  Final diagnoses:  Abscess, dental     This chart was dictated using voice  recognition software/Dragon. Despite best efforts to proofread, errors can occur which can change the meaning. Any change was purely unintentional.   Evangeline Dakin, PA-C 10/10/15 1421  Arnaldo Natal, MD 10/10/15 [phone]

## 2015-10-10 NOTE — ED Notes (Signed)
Patient presents to the ED with left side dental pain x 1 month.  Patient has some swelling to the left side of his mouth.  Patient is in no obvious distress at this time.

## 2015-10-10 NOTE — Discharge Instructions (Signed)
OPTIONS FOR DENTAL FOLLOW UP CARE ° °[place] Department of Health and Human Services - Local Safety Net Dental Clinics °http://www.ncdhhs.gov/dph/oralhealth/services/safetynetclinics.htm °  °[facility] (336-562-3123) ° °[facility] [place] ([phone]) ° °[facility] ([phone] ext [phone]) ° °[facility] ([phone]) ° °[facility] ([phone]) °This clinic caters to the indigent population and is on a lottery system. °Location: °[facility], [address], [place] °Clinic Hours: °Wednesdays from 6pm - 9pm, patients seen by a lottery system. °For dates, call or go to [email or web address]/[facility]/patients/[email or web address] °Services: °Cleanings, fillings and simple extractions. °Payment Options: °DENTAL WORK IS FREE OF CHARGE. Bring proof of income or support. °Best way to get seen: °Arrive at 5:15 pm - this is a lottery, NOT first come/first serve, so arriving earlier will not increase your chances of being seen. °  °  °[facility] °[phone] °Select option 1 for emergencies °  °Location: °[facility], [address], [place] °Clinic Hours: °No walk-ins accepted - call the day before to schedule an appointment. °Check in times are 9:30 am and 1:30 pm. °Services: °Simple extractions, temporary fillings, pulpectomy/pulp debridement, uncomplicated abscess drainage. °Payment Options: °PAYMENT IS DUE AT THE TIME OF SERVICE.  Fee is usually $100-200, additional surgical procedures (e.g. abscess drainage) may be extra. °Cash, checks, Visa/MasterCard accepted.  Can file Medicaid if patient is covered for dental - patient should call case worker to check. °No discount for [facility] patients. °Best way to get seen: °MUST call the day before and get onto the schedule. Can usually be seen the next 1-2 days. No walk-ins accepted. °  °  °[facility] °[phone] °   °Location: °[facility], [address], [place] °Clinic Hours: °M, W, Th, F 8am or 1:30pm, Tues 9a or 1:30 - first come/first served. °Services: °Simple extractions, temporary fillings, uncomplicated abscess drainage.  You do not need to be an [place] resident. °Payment Options: °PAYMENT IS DUE AT THE TIME OF SERVICE. °Dental insurance, otherwise sliding scale - bring proof of income or support. °Depending on income and treatment needed, cost is usually $50-200. °Best way to get seen: °Arrive early as it is first come/first served. °  °  °[facility] °[phone] °  °Location: °[address] [place] °Clinic Hours: °Mon-Thu 8a-5p °Services: °Most basic dental services including extractions and fillings. °Payment Options: °PAYMENT IS DUE AT THE TIME OF SERVICE. °Sliding scale, up to 50% off - bring proof if income or support. °Medicaid with dental option accepted. °Best way to get seen: °Call to schedule an appointment, can usually be seen within 2 weeks OR they will try to see walk-ins - show up at 8a or 2p (you may have to wait). °  °  °[facility] °[phone] °[place] RESIDENTS ONLY °  °Location: °[facility], [address], [place], [place] [address] °Clinic Hours: By appointment only. °Monday - Thursday 8am-5pm, Friday 8am-12pm °Services: Cleanings, fillings, extractions. °Payment Options: °PAYMENT IS DUE AT THE TIME OF SERVICE. °Cash, Visa or MasterCard. Sliding scale - $30 minimum per service. °Best way to get seen: °Come in to office, complete packet and make an appointment - need proof of income °or support monies for each household member and proof of [place] residence. °Usually takes about a month to get in. °  °  °[facility] °[phone] °  °Location: °[address]   [place] °Clinic Hours: Walk-in Urgent Care Dental Services are offered Monday-Friday  mornings only. °The numbers of emergencies accepted daily is limited to the number of °providers available. °Maximum 15 - Mondays, Wednesdays & Thursdays °Maximum 10 - Tuesdays & Fridays °Services: °You do not need to be a [place] resident to be seen for a dental emergency. °Emergencies are defined as pain, swelling, abnormal bleeding, or dental trauma. Walkins will receive x-rays if needed. °NOTE: Dental cleaning is not an emergency. °Payment Options: °PAYMENT IS DUE AT THE TIME OF SERVICE. °Minimum co-pay is $40.00 for uninsured patients. °Minimum co-pay is $3.00 for Medicaid with dental coverage. °Dental Insurance is accepted and must be presented at time of visit. °Medicare does not cover dental. °Forms of payment: Cash, credit card, checks. °Best way to get seen: °If not previously registered with the clinic, walk-in dental registration begins at 7:15 am and is on a first come/first serve basis. °If previously registered with the clinic, call to make an appointment. °  °  °[facility] °[phone] °[place] RESIDENTS ONLY °  °Location: °[address], [place], [place] °Clinic Hours: °Mon-Thu 10a-2p °Services: Extractions only! °Payment Options: °FREE (donations accepted) - bring proof of income or support °Best way to get seen: °Call and schedule an appointment OR come at 8am on the 1st Monday of every month (except for holidays) when it is first come/first served. °  °  °[facility] °[phone] °  °Location: °[address], [place] °Clinic Hours: °Friday mornings °Services, Payment Options, Best way to get seen: °Call for info ° ° °Dental Abscess °A dental abscess is a collection of pus in or around a tooth. °CAUSES °This condition is caused by a bacterial infection around the root of the tooth that involves the inner part of the tooth (pulp). It may result from: °· Severe tooth decay. °· Trauma to the tooth that allows bacteria to enter into the pulp, such as a broken or chipped  tooth. °· Severe gum disease around a tooth. °SYMPTOMS °Symptoms of this condition include: °· Severe pain in and around the infected tooth. °· Swelling and redness around the infected tooth, in the mouth, or in the face. °· Tenderness. °· Pus drainage. °· Bad breath. °· Bitter taste in the mouth. °· Difficulty swallowing. °· Difficulty opening the mouth. °· Nausea. °· Vomiting. °· Chills. °· Swollen neck glands. °· Fever. °DIAGNOSIS °This condition is diagnosed with examination of the infected tooth. During the exam, your dentist may tap on the infected tooth. Your dentist will also ask about your medical and dental history and may order X-rays. °TREATMENT °This condition is treated by eliminating the infection. This may be done with: °· Antibiotic medicine. °· A root canal. This may be performed to save the tooth. °· Pulling (extracting) the tooth. This may also involve draining the abscess. This is done if the tooth cannot be saved. °HOME CARE INSTRUCTIONS °· Take medicines only as directed by your dentist. °· If you were prescribed antibiotic medicine, finish all of it even if you start to feel better. °· Rinse your mouth (gargle) often with salt water to relieve pain or swelling. °· Do not drive or operate heavy machinery while taking pain medicine. °· Do not apply heat to the outside of your mouth. °· Keep all follow-up visits as directed by your dentist. This is important. °SEEK MEDICAL CARE IF: °· Your pain is worse and is not helped by medicine. °SEEK IMMEDIATE MEDICAL CARE IF: °·   You have a fever or chills. °· Your symptoms suddenly get worse. °· You have a very bad headache. °· You have problems breathing or swallowing. °· You have trouble opening your mouth. °· You have swelling in your neck or around your eye. °  °This information is not intended to replace advice given to you by your health care provider. Make sure you discuss any questions you have with your health care provider. °  °Document  Released: 05/05/2005 Document Revised: 09/19/2014 Document Reviewed: 05/02/2014 °Elsevier Interactive Patient Education ©2016 [employer] ° °

## 2015-10-10 NOTE — ED Notes (Signed)
Discussed discharge instructions, prescriptions, and follow-up care with patient. No questions or concerns at this time. Pt stable at discharge.  

## 2016-04-30 ENCOUNTER — Emergency Department
Admission: EM | Admit: 2016-04-30 | Discharge: 2016-04-30 | Discharge status: Against Medical Advice | Payer: Self-pay | Attending: Emergency Medicine | Admitting: Emergency Medicine

## 2016-04-30 DIAGNOSIS — Z791 Long term (current) use of non-steroidal anti-inflammatories (NSAID): Secondary | ICD-10-CM | POA: Insufficient documentation

## 2016-04-30 DIAGNOSIS — F17200 Nicotine dependence, unspecified, uncomplicated: Secondary | ICD-10-CM | POA: Insufficient documentation

## 2016-04-30 DIAGNOSIS — K5792 Diverticulitis of intestine, part unspecified, without perforation or abscess without bleeding: Secondary | ICD-10-CM

## 2016-04-30 DIAGNOSIS — R1032 Left lower quadrant pain: Secondary | ICD-10-CM

## 2016-04-30 DIAGNOSIS — K5790 Diverticulosis of intestine, part unspecified, without perforation or abscess without bleeding: Secondary | ICD-10-CM | POA: Insufficient documentation

## 2016-04-30 DIAGNOSIS — I10 Essential (primary) hypertension: Secondary | ICD-10-CM | POA: Insufficient documentation

## 2016-04-30 HISTORY — DX: Diverticulitis of intestine, part unspecified, without perforation or abscess without bleeding: K57.92

## 2016-04-30 LAB — URINALYSIS, COMPLETE (UACMP) WITH MICROSCOPIC
BACTERIA UA: NONE SEEN
Bilirubin Urine: NEGATIVE
Glucose, UA: NEGATIVE mg/dL
Hgb urine dipstick: NEGATIVE
Ketones, ur: 5 mg/dL — AB
Leukocytes, UA: NEGATIVE
Nitrite: NEGATIVE
PROTEIN: NEGATIVE mg/dL
Specific Gravity, Urine: 1.026 (ref 1.005–1.030)
Squamous Epithelial / LPF: NONE SEEN
pH: 5 (ref 5.0–8.0)

## 2016-04-30 LAB — CBC
HEMATOCRIT: 50.8 % (ref 40.0–52.0)
HEMOGLOBIN: 18.4 g/dL — AB (ref 13.0–18.0)
MCH: 33.7 pg (ref 26.0–34.0)
MCHC: 36.3 g/dL — AB (ref 32.0–36.0)
MCV: 92.9 fL (ref 80.0–100.0)
Platelets: 253 10*3/uL (ref 150–440)
RBC: 5.47 MIL/uL (ref 4.40–5.90)
RDW: 14.2 % (ref 11.5–14.5)
WBC: 10.6 10*3/uL (ref 3.8–10.6)

## 2016-04-30 LAB — COMPREHENSIVE METABOLIC PANEL
ALBUMIN: 4.3 g/dL (ref 3.5–5.0)
ALK PHOS: 70 U/L (ref 38–126)
ALT: 32 U/L (ref 17–63)
ANION GAP: 11 (ref 5–15)
AST: 33 U/L (ref 15–41)
BUN: 13 mg/dL (ref 6–20)
CALCIUM: 9.1 mg/dL (ref 8.9–10.3)
CO2: 30 mmol/L (ref 22–32)
Chloride: 97 mmol/L — ABNORMAL LOW (ref 101–111)
Creatinine, Ser: 1.11 mg/dL (ref 0.61–1.24)
GFR calc Af Amer: >60 mL/min (ref >60)
GFR calc non Af Amer: >60 mL/min (ref >60)
GLUCOSE: 90 mg/dL (ref 65–99)
Potassium: 3.9 mmol/L (ref 3.5–5.1)
SODIUM: 138 mmol/L (ref 135–145)
Total Bilirubin: 0.7 mg/dL (ref 0.3–1.2)
Total Protein: 7.9 g/dL (ref 6.5–8.1)

## 2016-04-30 LAB — LIPASE, BLOOD: Lipase: 36 U/L (ref 11–51)

## 2016-04-30 MED ORDER — CIPROFLOXACIN HCL 500 MG PO TABS: 500.0000 mg | ORAL_TABLET | Freq: Two times a day (BID) | ORAL | 0 refills | Status: DC

## 2016-04-30 MED ORDER — METRONIDAZOLE 500 MG PO TABS: 500.0000 mg | ORAL_TABLET | Freq: Three times a day (TID) | ORAL | 0 refills | Status: DC

## 2016-04-30 MED ORDER — KETOROLAC TROMETHAMINE 30 MG/ML IJ SOLN: 15.0000 mg | INTRAMUSCULAR | Status: DC

## 2016-04-30 MED ORDER — ONDANSETRON 4 MG PO TBDP: 4.0000 mg | ORAL_TABLET | Freq: Three times a day (TID) | ORAL | 0 refills | Status: DC | PRN

## 2016-04-30 MED ORDER — IOPAMIDOL (ISOVUE-300) INJECTION 61%
30.0000 mL | Freq: Once | INTRAVENOUS | Status: DC
  Filled 2016-04-30: qty 30

## 2016-04-30 MED ORDER — NAPROXEN 500 MG PO TABS: 500.0000 mg | ORAL_TABLET | Freq: Two times a day (BID) | ORAL | 0 refills | Status: DC

## 2016-04-30 NOTE — ED Notes (Signed)
A/o. Lower abd pain from umbilicus to left. Pain 7/10. Hypoactive BS. Denies changes in urination or stool.

## 2016-04-30 NOTE — ED Notes (Signed)
Pt unable to provide urine sample at this time. Given labeled specimen cup.

## 2016-04-30 NOTE — ED Triage Notes (Signed)
Pt c/o LLQ stabbing pain X 2 days, hx of diverticulitis. Denies NVD. Pt alert and oriented X4, active, cooperative, pt in NAD. RR even and unlabored, color WNL.

## 2016-04-30 NOTE — ED Notes (Addendum)
Discharge instructions reviewed with patient. Questions fielded by this RN. Patient verbalizes understanding of instructions. [facility] . No acute distress noted at time of discharge. Pt states doesn't want CT just wants Abts and to go home. Pt aware of of risks of not getting CT such as perforation and abscess. Pt signed AMA form prior to leaving.

## 2016-04-30 NOTE — ED Notes (Signed)
Pt refused to have an IV catheter inserted and refuses to go to CT. Pt states that "all he wants is antibiotisc and go home because he knows that its diverticulitis." Dr. Scotty CourtStafford was notified.

## 2016-04-30 NOTE — ED Provider Notes (Signed)
[facility] Emergency Department Provider Note  ____________________________________________  Time seen: Approximately 8:19 PM  I have reviewed the triage vital signs and the nursing notes.   HISTORY  Chief Complaint Abdominal Pain    HPI Jim [name] is a 35 y.o. male with a history of diverticulitis who complains of left lower quadrant abdominal pain that's been gradual onset and worsening over the past 4 days. At its worst it's very severe. Nonradiating. No nausea vomiting or diarrhea. Sharp and stabbing. Worse with bending and movement and Valsalva maneuvers. No alleviating factors. No urinary symptoms. No bloody stool. No fever or chills. No dizziness.     Past Medical History:  Diagnosis Date  . Diverticulitis   . GERD (gastroesophageal reflux disease)   . Hypertension      There are no active problems to display for this patient.    History reviewed. No pertinent surgical history.   Prior to Admission medications   Medication Sig Start Date End Date Taking? Authorizing Provider  amoxicillin (AMOXIL) 500 MG tablet Take 1 tablet (500 mg total) by mouth 2 (two) times daily. 10/10/15   Charmayne Sheerharles M Beers, PA-C  ciprofloxacin (CIPRO) 500 MG tablet Take 1 tablet (500 mg total) by mouth 2 (two) times daily. 04/30/16   Sharman CheekPhillip Tiegan Jambor, MD  ibuprofen (ADVIL,MOTRIN) 800 MG tablet Take 1 tablet (800 mg total) by mouth every 8 (eight) hours as needed. 10/10/15   Charmayne Sheerharles M Beers, PA-C  metroNIDAZOLE (FLAGYL) 500 MG tablet Take 1 tablet (500 mg total) by mouth 3 (three) times daily. 04/30/16   Sharman CheekPhillip Sheralee Qazi, MD  naproxen (NAPROSYN) 500 MG tablet Take 1 tablet (500 mg total) by mouth 2 (two) times daily with a meal. 04/30/16   Sharman CheekPhillip Adelfo Diebel, MD  ondansetron (ZOFRAN ODT) 4 MG disintegrating tablet Take 1 tablet (4 mg total) by mouth every 8 (eight) hours as needed for nausea or vomiting. 04/30/16   Sharman CheekPhillip Braeton Wolgamott, MD  oxyCODONE-acetaminophen (ROXICET)  5-325 MG tablet Take 1-2 tablets by mouth every 4 (four) hours as needed for severe pain. 10/10/15   Evangeline Dakinharles M Beers, PA-C     Allergies Patient has no known allergies.   No family history on file.  Social History Social History  Substance Use Topics  . Smoking status: Current Every Day Smoker  . Smokeless tobacco: Never Used  . Alcohol use No    Review of Systems  Constitutional:   No fever or chills.  ENT:   No sore throat. No rhinorrhea. Cardiovascular:   No chest pain. Respiratory:   No dyspnea or cough. Gastrointestinal:   Positive left lower quadrant abdominal pain as above without vomiting and diarrhea.  Genitourinary:   Negative for dysuria or difficulty urinating. Musculoskeletal:   Negative for focal pain or swelling Neurological:   Negative for headaches 10-point ROS otherwise negative.  ____________________________________________   PHYSICAL EXAM:  VITAL SIGNS: ED Triage Vitals [04/30/16 1718]  Enc Vitals Group     BP (!) 139/108     Pulse Rate (!) 112     Resp 18     Temp 97.9 F (36.6 C)     Temp Source Oral     SpO2 97 %     Weight 190 lb (86.2 kg)     Height 6\' 2"  (1.88 m)     Head Circumference      Peak Flow      Pain Score 10     Pain Loc      Pain  Edu?      Excl. in GC?     Vital signs reviewed, nursing assessments reviewed.   Constitutional:   Alert and oriented. Well appearing and in no distress. Eyes:   No scleral icterus. No conjunctival pallor. PERRL. EOMI.  No nystagmus. ENT   Head:   Normocephalic and atraumatic.   Nose:   No congestion/rhinnorhea. No septal hematoma   Mouth/Throat:   MMM, no pharyngeal erythema. No peritonsillar mass.    Neck:   No stridor. No SubQ emphysema. No meningismus. Hematological/Lymphatic/Immunilogical:   No cervical lymphadenopathy. Cardiovascular:   RRR. Symmetric bilateral radial and DP pulses.  No murmurs.  Respiratory:   Normal respiratory effort without tachypnea nor retractions.  Breath sounds are clear and equal bilaterally. No wheezes/rales/rhonchi. Gastrointestinal:   Soft With left lower quadrant abdominal tenderness and guarding. Non distended. There is no CVA tenderness.  No rebound or rigidity. Genitourinary:   deferred Musculoskeletal:   Nontender with normal range of motion in all extremities. No joint effusions.  No lower extremity tenderness.  No edema. Neurologic:   Normal speech and language.  CN 2-10 normal. Motor grossly intact. No gross focal neurologic deficits are appreciated.  Skin:    Skin is warm, dry and intact. No rash noted.  No petechiae, purpura, or bullae.  ____________________________________________    LABS (pertinent positives/negatives) (all labs ordered are listed, but only abnormal results are displayed) Labs Reviewed  COMPREHENSIVE METABOLIC PANEL - Abnormal; Notable for the following:       Result Value   Chloride 97 (*)    All other components within normal limits  CBC - Abnormal; Notable for the following:    Hemoglobin 18.4 (*)    MCHC 36.3 (*)    All other components within normal limits  URINALYSIS, COMPLETE (UACMP) WITH MICROSCOPIC - Abnormal; Notable for the following:    Color, Urine YELLOW (*)    APPearance CLEAR (*)    Ketones, ur 5 (*)    All other components within normal limits  LIPASE, BLOOD   ____________________________________________   EKG    ____________________________________________    RADIOLOGY    ____________________________________________   PROCEDURES Procedures  ____________________________________________   INITIAL IMPRESSION / ASSESSMENT AND PLAN / ED COURSE  Pertinent labs & imaging results that were available during my care of the patient were reviewed by me and considered in my medical decision making (see chart for details).  Patient not in distress but presents with tachycardia and abdominal exam concerning for diverticulitis. Labs are unremarkable, but with  guarding of the abdomen, I recommended a CT scan of the abdomen pelvis to evaluate for possible complications such as perforation or abscess formation. Him and the patient is comfortable with the clinical diagnosis of diverticulitis and refuses the CT scan and wants to go home with antibiotics.   The patient has medical decision-making capacity and is calm and rational and so we will certainly adhere to his wishes directing his medical care. However, Given the tachycardia and abdominal exam, I consider this discharged to be AGAINST MEDICAL ADVICE as I think further workup for potential surgical complication is necessary. I did discuss these concerns with the patient during my assessment.     Clinical Course    ____________________________________________   FINAL CLINICAL IMPRESSION(S) / ED DIAGNOSES  Final diagnoses:  Left lower quadrant pain  Diverticulitis of intestine, unspecified bleeding status, unspecified complication status, unspecified part of intestinal tract      New Prescriptions   CIPROFLOXACIN (CIPRO) 500  MG TABLET    Take 1 tablet (500 mg total) by mouth 2 (two) times daily.   METRONIDAZOLE (FLAGYL) 500 MG TABLET    Take 1 tablet (500 mg total) by mouth 3 (three) times daily.   NAPROXEN (NAPROSYN) 500 MG TABLET    Take 1 tablet (500 mg total) by mouth 2 (two) times daily with a meal.   ONDANSETRON (ZOFRAN ODT) 4 MG DISINTEGRATING TABLET    Take 1 tablet (4 mg total) by mouth every 8 (eight) hours as needed for nausea or vomiting.     Portions of this note were generated with dragon dictation software. Dictation errors may occur despite best attempts at proofreading.    Sharman CheekPhillip Shine Scrogham, MD 04/30/16 2023

## 2016-11-23 ENCOUNTER — Emergency Department
Admission: EM | Admit: 2016-11-23 | Discharge: 2016-11-23 | Disposition: A | Discharge status: Home / Self Care | Payer: Self-pay | Attending: Emergency Medicine | Admitting: Emergency Medicine

## 2016-11-23 ENCOUNTER — Encounter: Payer: Self-pay | Admitting: Medical Oncology

## 2016-11-23 DIAGNOSIS — L237 Allergic contact dermatitis due to plants, except food: Secondary | ICD-10-CM | POA: Insufficient documentation

## 2016-11-23 DIAGNOSIS — Z79899 Other long term (current) drug therapy: Secondary | ICD-10-CM | POA: Insufficient documentation

## 2016-11-23 DIAGNOSIS — I10 Essential (primary) hypertension: Secondary | ICD-10-CM | POA: Insufficient documentation

## 2016-11-23 DIAGNOSIS — F17200 Nicotine dependence, unspecified, uncomplicated: Secondary | ICD-10-CM | POA: Insufficient documentation

## 2016-11-23 MED ORDER — DIPHENHYDRAMINE HCL 25 MG PO CAPS
25.0000 mg | ORAL_CAPSULE | Freq: Once | ORAL | Status: AC
  Administered 2016-11-23: 25 mg via ORAL
  Filled 2016-11-23: qty 1

## 2016-11-23 MED ORDER — DIPHENHYDRAMINE HCL 25 MG PO CAPS: 25.0000 mg | ORAL_CAPSULE | ORAL | 0 refills | Status: DC | PRN

## 2016-11-23 MED ORDER — PREDNISONE 10 MG (21) PO TBPK: ORAL_TABLET | ORAL | 0 refills | Status: DC

## 2016-11-23 MED ORDER — PREDNISONE 20 MG PO TABS
60.0000 mg | ORAL_TABLET | Freq: Once | ORAL | Status: AC
  Administered 2016-11-23: 60 mg via ORAL
  Filled 2016-11-23: qty 3

## 2016-11-23 NOTE — ED Triage Notes (Signed)
Pt c/o rash all over for 3-4 days. Pt reports rash is itchy. In no resp distress.

## 2016-11-23 NOTE — ED Notes (Signed)

## 2016-11-23 NOTE — ED Provider Notes (Signed)
[facility] Emergency Department Provider Note  ____________________________________________  Time seen: Approximately 8:05 PM  I have reviewed the triage vital signs and the nursing notes.   HISTORY  Chief Complaint Rash   HPI Jim [name] is a 36 y.o. male who presents to the emergency department for evaluation of a red, raised rash on the bilateral arms, legs, and left flank. Patient states that this started 3 days ago after weeding. He has had similar symptoms in the past with exposure to poison ivy/oak. He has not taken any medications to alleviate the itching. He did try and soak in a tub with some bleach without relief. he also states that he has a PMH of hypertension, but doesn't take his medication.  Past Medical History:  Diagnosis Date  . Diverticulitis   . GERD (gastroesophageal reflux disease)   . Hypertension     There are no active problems to display for this patient.   History reviewed. No pertinent surgical history.  Prior to Admission medications   Medication Sig Start Date End Date Taking? Authorizing Provider  amoxicillin (AMOXIL) 500 MG tablet Take 1 tablet (500 mg total) by mouth 2 (two) times daily. 10/10/15   Beers, Charmayne Sheerharles M, PA-C  ciprofloxacin (CIPRO) 500 MG tablet Take 1 tablet (500 mg total) by mouth 2 (two) times daily. 04/30/16   Sharman CheekStafford, Phillip, MD  diphenhydrAMINE (BENADRYL) 25 mg capsule Take 1 capsule (25 mg total) by mouth every 4 (four) hours as needed. 11/23/16 11/23/17  Dorcus Riga, Rulon Eisenmengerari B, FNP  ibuprofen (ADVIL,MOTRIN) 800 MG tablet Take 1 tablet (800 mg total) by mouth every 8 (eight) hours as needed. 10/10/15   Beers, Charmayne Sheerharles M, PA-C  metroNIDAZOLE (FLAGYL) 500 MG tablet Take 1 tablet (500 mg total) by mouth 3 (three) times daily. 04/30/16   Sharman CheekStafford, Phillip, MD  naproxen (NAPROSYN) 500 MG tablet Take 1 tablet (500 mg total) by mouth 2 (two) times daily with a meal. 04/30/16   Sharman CheekStafford, Phillip, MD  ondansetron (ZOFRAN  ODT) 4 MG disintegrating tablet Take 1 tablet (4 mg total) by mouth every 8 (eight) hours as needed for nausea or vomiting. 04/30/16   Sharman CheekStafford, Phillip, MD  oxyCODONE-acetaminophen (ROXICET) 5-325 MG tablet Take 1-2 tablets by mouth every 4 (four) hours as needed for severe pain. 10/10/15   Beers, Charmayne Sheerharles M, PA-C  predniSONE (STERAPRED UNI-PAK 21 TAB) 10 MG (21) TBPK tablet Take 6 tablets on day 1 Take 5 tablets on day 2 Take 4 tablets on day 3 Take 3 tablets on day 4 Take 2 tablets on day 5 Take 1 tablet on day 6 11/23/16   Kem Boroughsriplett, Arpi Diebold B, FNP    Allergies Patient has no known allergies.  No family history on file.  Social History Social History  Substance Use Topics  . Smoking status: Current Every Day Smoker  . Smokeless tobacco: Never Used  . Alcohol use No    Review of Systems  Constitutional: Negative for fever.  Respiratory: Negative for cough or shortness of breath.  Musculoskeletal: Negative for myalgias.  Skin: Positive for rash Neurological: Negative for paresthesias or radiculopathy ____________________________________________   PHYSICAL EXAM:  VITAL SIGNS: ED Triage Vitals  Enc Vitals Group     BP 11/23/16 1814 (!) 151/105     Pulse Rate 11/23/16 1814 (!) 102     Resp 11/23/16 1814 18     Temp 11/23/16 1814 98.4 F (36.9 C)     Temp Source 11/23/16 1814 Oral  SpO2 11/23/16 1814 100 %     Weight 11/23/16 1812 225 lb (102.1 kg)     Height 11/23/16 1812 6\' 2"  (1.88 m)     Head Circumference --      Peak Flow --      Pain Score --      Pain Loc --      Pain Edu? --      Excl. in GC? --      Constitutional: Well appearing. Eyes: Conjunctivae are clear without discharge or drainage. Nose: No rhinorrhea noted. Mouth/Throat: Airway is patent. No swelling of the tongue is noted. Neck: Active, full range of motion.  Cardiovascular: Capillary refill is less than 3 seconds throughout. Respiratory: Respirations are even and unlabored. Breath sounds are  clear to auscultation throughout.. Musculoskeletal: Full, active range of motion throughout the extremities. Neurologic: Alert and oriented 4. Skin:  Diffuse, vesicular rash noted on the forearms and lower extremities.  ____________________________________________   LABS (all labs ordered are listed, but only abnormal results are displayed)  Labs Reviewed - No data to display ____________________________________________  EKG  Not indicated ____________________________________________  RADIOLOGY  Not indicated ____________________________________________   PROCEDURES  Procedure(s) performed: None ____________________________________________   INITIAL IMPRESSION / ASSESSMENT AND PLAN / ED COURSE  Jim [name] is a 36 y.o. male who presents to the emergency department for evaluation and treatment of rash as in present for the past 3 or 4 days. Exam and symptoms are consistent with poison oak or poison ivy dermatitis and will be treated with prednisone. He will be prescribed a taper dose pack of prednisone and given a prescription for Benadryl as well.  Pertinent labs & imaging results that were available during my care of the patient were reviewed by me and considered in my medical decision making (see chart for details). ____________________________________________   FINAL CLINICAL IMPRESSION(S) / ED DIAGNOSES  Final diagnoses:  Contact dermatitis due to poison ivy    Discharge Medication List as of 11/23/2016  8:15 PM    START taking these medications   Details  diphenhydrAMINE (BENADRYL) 25 mg capsule Take 1 capsule (25 mg total) by mouth every 4 (four) hours as needed., Starting Sun 11/23/2016, Until Mon 11/23/2017, Print    predniSONE (STERAPRED UNI-PAK 21 TAB) 10 MG (21) TBPK tablet Take 6 tablets on day 1 Take 5 tablets on day 2 Take 4 tablets on day 3 Take 3 tablets on day 4 Take 2 tablets on day 5 Take 1 tablet on day 6, Print        If controlled  substance prescribed during this visit, 12 month history viewed on the [facility] prior to issuing an initial prescription for Schedule II or III opiod.   Note:  This document was prepared using Dragon voice recognition software and may include unintentional dictation errors.    Chinita Pester, FNP 11/23/16 2101    Minna Antis, MD 11/23/16 2314

## 2016-11-23 NOTE — ED Notes (Signed)
Pt with discrete red raised rash noted to bilateral arms, legs and left flank. Pt states it began after "weed eating" 3 days pta. Pt states history of htn for which he does not remember daily to take his antihypertensives. Pt does not know what HTN medication he is supposed to take. Pt states he did not take it today.

## 2016-11-23 NOTE — Discharge Instructions (Signed)
Please follow up with your primary care provider for symptoms that are not improving over the next few days. Your blood pressure is high today. You need to take your medication as prescribed in order to prevent long term damage to your heart, kidneys, and other organs. Return to the ER for symptoms that change or worsen.

## 2016-12-19 ENCOUNTER — Emergency Department
Admission: EM | Admit: 2016-12-19 | Discharge: 2016-12-19 | Disposition: A | Discharge status: Home / Self Care | Payer: Self-pay | Attending: Emergency Medicine | Admitting: Emergency Medicine

## 2016-12-19 ENCOUNTER — Encounter: Payer: Self-pay | Admitting: *Deleted

## 2016-12-19 DIAGNOSIS — F17200 Nicotine dependence, unspecified, uncomplicated: Secondary | ICD-10-CM | POA: Insufficient documentation

## 2016-12-19 DIAGNOSIS — I10 Essential (primary) hypertension: Secondary | ICD-10-CM | POA: Insufficient documentation

## 2016-12-19 DIAGNOSIS — K047 Periapical abscess without sinus: Secondary | ICD-10-CM | POA: Insufficient documentation

## 2016-12-19 DIAGNOSIS — K0889 Other specified disorders of teeth and supporting structures: Secondary | ICD-10-CM

## 2016-12-19 MED ORDER — AMOXICILLIN 500 MG PO TABS: 500.0000 mg | ORAL_TABLET | Freq: Two times a day (BID) | ORAL | 0 refills | Status: DC

## 2016-12-19 MED ORDER — NAPROXEN 500 MG PO TABS: 500.0000 mg | ORAL_TABLET | Freq: Two times a day (BID) | ORAL | 0 refills | Status: AC

## 2016-12-19 MED ORDER — NAPROXEN 500 MG PO TABS
500.0000 mg | ORAL_TABLET | Freq: Once | ORAL | Status: AC
Start: 2016-12-19 — End: 2016-12-19
  Administered 2016-12-19: 500 mg via ORAL
  Filled 2016-12-19: qty 1

## 2016-12-19 NOTE — ED Triage Notes (Signed)
States right dental pain and swelling, states broken tooth on right side

## 2016-12-19 NOTE — ED Provider Notes (Signed)
[facility] Emergency Department Provider Note   ____________________________________________   I have reviewed the triage vital signs and the nursing notes.   HISTORY  Chief Complaint Dental Pain    HPI Jim [name] is a 36 y.o. male presents to emergency department with right lower jaw pain related to likely dental abscess and dental infection. Patient reports right lower dental pain for the past 2-3 days localized approximate the second premolar that is broken with significant dental caries.. Patient denies any fever, chills, sore throat or difficulty swallowing. Patient has had similar symptoms in the past and has been seen in a dental clinic with those teeth being removed.  Past Medical History:  Diagnosis Date  . Diverticulitis   . GERD (gastroesophageal reflux disease)   . Hypertension     There are no active problems to display for this patient.   History reviewed. No pertinent surgical history.  Prior to Admission medications   Medication Sig Start Date End Date Taking? Authorizing Provider  amoxicillin (AMOXIL) 500 MG tablet Take 1 tablet (500 mg total) by mouth 2 (two) times daily. 12/19/16   Little, Traci M, PA-C  ciprofloxacin (CIPRO) 500 MG tablet Take 1 tablet (500 mg total) by mouth 2 (two) times daily. 04/30/16   Sharman CheekStafford, Phillip, MD  diphenhydrAMINE (BENADRYL) 25 mg capsule Take 1 capsule (25 mg total) by mouth every 4 (four) hours as needed. 11/23/16 11/23/17  Triplett, Rulon Eisenmengerari B, FNP  ibuprofen (ADVIL,MOTRIN) 800 MG tablet Take 1 tablet (800 mg total) by mouth every 8 (eight) hours as needed. 10/10/15   Beers, Charmayne Sheerharles M, PA-C  metroNIDAZOLE (FLAGYL) 500 MG tablet Take 1 tablet (500 mg total) by mouth 3 (three) times daily. 04/30/16   Sharman CheekStafford, Phillip, MD  naproxen (NAPROSYN) 500 MG tablet Take 1 tablet (500 mg total) by mouth 2 (two) times daily with a meal. 12/19/16 12/19/17  Little, Traci M, PA-C  ondansetron (ZOFRAN ODT) 4 MG disintegrating  tablet Take 1 tablet (4 mg total) by mouth every 8 (eight) hours as needed for nausea or vomiting. 04/30/16   Sharman CheekStafford, Phillip, MD  oxyCODONE-acetaminophen (ROXICET) 5-325 MG tablet Take 1-2 tablets by mouth every 4 (four) hours as needed for severe pain. 10/10/15   Beers, Charmayne Sheerharles M, PA-C  predniSONE (STERAPRED UNI-PAK 21 TAB) 10 MG (21) TBPK tablet Take 6 tablets on day 1 Take 5 tablets on day 2 Take 4 tablets on day 3 Take 3 tablets on day 4 Take 2 tablets on day 5 Take 1 tablet on day 6 11/23/16   Kem Boroughsriplett, Cari B, FNP    Allergies Patient has no known allergies.  History reviewed. No pertinent family history.  Social History Social History  Substance Use Topics  . Smoking status: Current Every Day Smoker  . Smokeless tobacco: Never Used  . Alcohol use No    Review of Systems Constitutional: Negative for fever/chills Eyes: No visual changes. ENT:  Negative for sore throat and for difficulty swallowing. Right lower jaw dental pain. Cardiovascular: Denies chest pain. Respiratory: Denies cough. Denies shortness of breath. Gastrointestinal: No abdominal pain.  No nausea, vomiting, diarrhea. Genitourinary: Negative for dysuria. Musculoskeletal: Negative for back pain. Skin: Negative for rash. Neurological: Negative for headaches.  Negative focal weakness or numbness. Negative for loss of consciousness. Able to ambulate. ____________________________________________   PHYSICAL EXAM:  VITAL SIGNS: ED Triage Vitals [12/19/16 1700]  Enc Vitals Group     BP (!) 131/93     Pulse Rate 94  Resp 18     Temp 98.2 F (36.8 C)     Temp Source Oral     SpO2 98 %     Weight 230 lb (104.3 kg)     Height 6\' 2"  (1.88 m)     Head Circumference      Peak Flow      Pain Score 10     Pain Loc      Pain Edu?      Excl. in GC?      Constitutional: Alert and oriented. Well appearing and in no acute distress. Afebrile.  Head: Normocephalic and atraumatic. Eyes: Conjunctivae are  normal. PERRL. Normal extraocular movements.  Ears: Canals clear. TMs intact bilaterally. Nose: No congestion/rhinorrhea Mouth/Throat: Mucous membranes are moist. Oropharynx clear. Tonsils symmetrical bilaterally. Dental caries right lower premolar, #29 with gumline erythematous. Mucosal swelling.   Neck: Supple.  Hematological/Lymphatic/Immunological: No lymphadenopathy. Cardiovascular: Normal rate, regular rhythm. Normal distal pulses. Respiratory: Normal respiratory effort. No wheezes/rales/rhonchi. Lungs CTAB  Gastrointestinal: Soft and nontender.  Musculoskeletal: Nontender with normal range of motion in all extremities. Neurologic: Normal speech and language.  Skin:  Skin is warm, dry and intact. No rash noted. Psychiatric: Mood and affect are normal.  ____________________________________________   LABS (all labs ordered are listed, but only abnormal results are displayed)  Labs Reviewed - No data to display ____________________________________________  EKG  ____________________________________________  RADIOLOGY  ____________________________________________   PROCEDURES  Procedure(s) performed: no   Critical Care performed: no ____________________________________________   INITIAL IMPRESSION / ASSESSMENT AND PLAN / ED COURSE  Pertinent labs & imaging results that were available during my care of the patient were reviewed by me and considered in my medical decision making (see chart for details).  Patient presents to emergency department left lower jaw line dental pain along the premolar, #29. History and physical exam findings are reassuring symptoms are consistent with dental pain associated with dental infection. Patient given the initial dose of anti-inflammatory, Naprosyn, during the course of care in the emergency department. Patient be prescribed amoxicillin for an about a coverage and Naprosyn for inflammation and pain management as needed. Patient advised to  follow up with a dental clinic for dental caries and affected tooth or return to the emergency department if symptoms return or worsen. Patient informed of clinical course, understand medical decision-making process, and agree with plan.   ____________________________________________   FINAL CLINICAL IMPRESSION(S) / ED DIAGNOSES  Final diagnoses:  Pain, dental  Dental abscess       NEW MEDICATIONS STARTED DURING THIS VISIT:  Discharge Medication List as of 12/19/2016  5:52 PM       Note:  This document was prepared using Dragon voice recognition software and may include unintentional dictation errors.    Clois ComberLittle, Traci M, PA-C 12/19/16 1800    Phineas SemenGoodman, Graydon, MD 12/19/16 [id number]

## 2016-12-19 NOTE — Discharge Instructions (Signed)
OPTIONS FOR DENTAL FOLLOW UP CARE ° °[place] Department of Health and Human Services - Local Safety Net Dental Clinics °http://www.ncdhhs.gov/dph/oralhealth/services/safetynetclinics.htm °  °[facility] (336-562-3123) ° °[facility] ([phone]) ° °[facility] ([phone]) ° °[facility] ([phone]) ° °[facility] ([phone]) °This clinic caters to the indigent population and is on a lottery system. °Location: °[facility], [address], [place] °Clinic Hours: °Wednesdays from 6pm - 9pm, patients seen by a lottery system. °For dates, call or go to [email or web address] °Services: °Cleanings, fillings and simple extractions. °Payment Options: °DENTAL WORK IS FREE OF CHARGE. Bring proof of income or support. °Best way to get seen: °Arrive at 5:15 pm - this is a lottery, NOT first come/first serve, so arriving earlier will not increase your chances of being seen. °  °  °[facility] °[phone] °Select option 1 for emergencies °  °Location: °[facility], [address], [place] °Clinic Hours: °No walk-ins accepted - call the day before to schedule an appointment. °Check in times are 9:30 am and 1:30 pm. °Services: °Simple extractions, temporary fillings, pulpectomy/pulp debridement, uncomplicated abscess drainage. °Payment Options: °PAYMENT IS DUE AT THE TIME OF SERVICE.  Fee is usually $100-200, additional surgical procedures (e.g. abscess drainage) may be extra. °Cash, checks, Visa/MasterCard accepted.  Can file Medicaid if patient is covered for dental - patient should call case worker to check. °No discount for [facility] patients. °Best way to get seen: °MUST call the day before and get onto the schedule. Can usually be seen the next 1-2 days. No walk-ins accepted. °  °  °[facility] °[phone] °   °Location: °[facility], [address], [place] °Clinic Hours: °M, W, Th, F 8am or 1:30pm, Tues 9a or 1:30 - first come/first served. °Services: °Simple extractions, temporary fillings, uncomplicated abscess drainage.  You do not need to be an [place] resident. °Payment Options: °PAYMENT IS DUE AT THE TIME OF SERVICE. °Dental insurance, otherwise sliding scale - bring proof of income or support. °Depending on income and treatment needed, cost is usually $50-200. °Best way to get seen: °Arrive early as it is first come/first served. °  °  °[facility] °[phone] °  °Location: °[address] °Clinic Hours: °Mon-Thu 8a-5p °Services: °Most basic dental services including extractions and fillings. °Payment Options: °PAYMENT IS DUE AT THE TIME OF SERVICE. °Sliding scale, up to 50% off - bring proof if income or support. °Medicaid with dental option accepted. °Best way to get seen: °Call to schedule an appointment, can usually be seen within 2 weeks OR they will try to see walk-ins - show up at 8a or 2p (you may have to wait). °  °  °[facility] °[phone] °[place] RESIDENTS ONLY °  °Location: °[facility], [address], [place], [place] [address] °Clinic Hours: By appointment only. °Monday - Thursday 8am-5pm, Friday 8am-12pm °Services: Cleanings, fillings, extractions. °Payment Options: °PAYMENT IS DUE AT THE TIME OF SERVICE. °Cash, Visa or MasterCard. Sliding scale - $30 minimum per service. °Best way to get seen: °Come in to office, complete packet and make an appointment - need proof of income °or support monies for each household member and proof of [place] residence. °Usually takes about a month to get in. °  °  °[facility] °[phone] °  °Location: °[address]   [place] °Clinic Hours: Walk-in Urgent Care Dental Services are offered Monday-Friday  mornings only. °The numbers of emergencies accepted daily is limited to the number of °providers available. °Maximum 15 - Mondays, Wednesdays & Thursdays °Maximum 10 - Tuesdays & Fridays °Services: °You do not need to be a [place] resident to be seen for a dental emergency. °Emergencies are defined as pain, swelling, abnormal bleeding, or dental trauma. Walkins will receive x-rays if needed. °NOTE: Dental cleaning is not an emergency. °Payment Options: °PAYMENT IS DUE AT THE TIME OF SERVICE. °Minimum co-pay is $40.00 for uninsured patients. °Minimum co-pay is $3.00 for Medicaid with dental coverage. °Dental Insurance is accepted and must be presented at time of visit. °Medicare does not cover dental. °Forms of payment: Cash, credit card, checks. °Best way to get seen: °If not previously registered with the clinic, walk-in dental registration begins at 7:15 am and is on a first come/first serve basis. °If previously registered with the clinic, call to make an appointment. °  °  °[facility] °[phone] °[place] RESIDENTS ONLY °  °Location: °[address], [place], [place] °Clinic Hours: °Mon-Thu 10a-2p °Services: Extractions only! °Payment Options: °FREE (donations accepted) - bring proof of income or support °Best way to get seen: °Call and schedule an appointment OR come at 8am on the 1st Monday of every month (except for holidays) when it is first come/first served. °  °  °[facility] °[phone] °  °Location: °[address], [place] °Clinic Hours: °Friday mornings °Services, Payment Options, Best way to get seen: °Call for info °

## 2017-12-24 ENCOUNTER — Encounter: Payer: Self-pay | Admitting: Emergency Medicine

## 2017-12-24 ENCOUNTER — Emergency Department
Admission: EM | Admit: 2017-12-24 | Discharge: 2017-12-24 | Disposition: A | Discharge status: Home / Self Care | Payer: Self-pay | Attending: Emergency Medicine | Admitting: Emergency Medicine

## 2017-12-24 DIAGNOSIS — I10 Essential (primary) hypertension: Secondary | ICD-10-CM | POA: Insufficient documentation

## 2017-12-24 DIAGNOSIS — K047 Periapical abscess without sinus: Secondary | ICD-10-CM | POA: Insufficient documentation

## 2017-12-24 DIAGNOSIS — Z79899 Other long term (current) drug therapy: Secondary | ICD-10-CM | POA: Insufficient documentation

## 2017-12-24 DIAGNOSIS — K029 Dental caries, unspecified: Secondary | ICD-10-CM | POA: Insufficient documentation

## 2017-12-24 DIAGNOSIS — F17200 Nicotine dependence, unspecified, uncomplicated: Secondary | ICD-10-CM | POA: Insufficient documentation

## 2017-12-24 MED ORDER — LIDOCAINE VISCOUS HCL 2 % MT SOLN
15.0000 mL | Freq: Once | OROMUCOSAL | Status: AC
  Administered 2017-12-24: 15 mL via OROMUCOSAL
  Filled 2017-12-24: qty 15

## 2017-12-24 MED ORDER — IBUPROFEN 800 MG PO TABS
800.0000 mg | ORAL_TABLET | Freq: Once | ORAL | Status: AC
  Administered 2017-12-24: 800 mg via ORAL
  Filled 2017-12-24: qty 1

## 2017-12-24 MED ORDER — IBUPROFEN 600 MG PO TABS: 600.0000 mg | ORAL_TABLET | Freq: Four times a day (QID) | ORAL | 0 refills | Status: DC | PRN

## 2017-12-24 MED ORDER — TRAMADOL HCL 50 MG PO TABS: 50.0000 mg | ORAL_TABLET | Freq: Two times a day (BID) | ORAL | 0 refills | Status: DC | PRN

## 2017-12-24 MED ORDER — AMOXICILLIN 500 MG PO CAPS: 500.0000 mg | ORAL_CAPSULE | Freq: Three times a day (TID) | ORAL | 0 refills | Status: DC

## 2017-12-24 NOTE — ED Provider Notes (Signed)
[facility] Emergency Department Provider Note   ____________________________________________   First MD Initiated Contact with Patient 12/24/17 1010     (approximate)  I have reviewed the triage vital signs and the nursing notes.   HISTORY  Chief Complaint Dental Pain    HPI Jim [name] is a 37 y.o. male patient complain of left dental pain and facial swelling which started yesterday.  Patient state he had a dental appointment scheduled for today but has canceled secondary to his mother being admitted to the hospital.  Patient denies fever this complaint.  Patient rates the pain is 8/10.  Patient describes the pain as "achy".  No palliative measure for complaint.  Past Medical History:  Diagnosis Date  . Diverticulitis   . GERD (gastroesophageal reflux disease)   . Hypertension     There are no active problems to display for this patient.   History reviewed. No pertinent surgical history.  Prior to Admission medications   Medication Sig Start Date End Date Taking? Authorizing Provider  amoxicillin (AMOXIL) 500 MG tablet Take 500 mg by mouth 2 (two) times daily.   Yes [provider]  amoxicillin (AMOXIL) 500 MG capsule Take 1 capsule (500 mg total) by mouth 3 (three) times daily. 12/24/17   Joni ReiningSmith, Ronald K, PA-C  amoxicillin (AMOXIL) 500 MG tablet Take 1 tablet (500 mg total) by mouth 2 (two) times daily. 12/19/16   Little, Traci M, PA-C  ciprofloxacin (CIPRO) 500 MG tablet Take 1 tablet (500 mg total) by mouth 2 (two) times daily. 04/30/16   Sharman CheekStafford, Phillip, MD  diphenhydrAMINE (BENADRYL) 25 mg capsule Take 1 capsule (25 mg total) by mouth every 4 (four) hours as needed. 11/23/16 11/23/17  Triplett, Rulon Eisenmengerari B, FNP  ibuprofen (ADVIL,MOTRIN) 600 MG tablet Take 1 tablet (600 mg total) by mouth every 6 (six) hours as needed. 12/24/17   Joni ReiningSmith, Ronald K, PA-C  ibuprofen (ADVIL,MOTRIN) 800 MG tablet Take 1 tablet (800 mg total) by mouth every 8 (eight)  hours as needed. 10/10/15   Beers, Charmayne Sheerharles M, PA-C  metroNIDAZOLE (FLAGYL) 500 MG tablet Take 1 tablet (500 mg total) by mouth 3 (three) times daily. 04/30/16   Sharman CheekStafford, Phillip, MD  ondansetron (ZOFRAN ODT) 4 MG disintegrating tablet Take 1 tablet (4 mg total) by mouth every 8 (eight) hours as needed for nausea or vomiting. 04/30/16   Sharman CheekStafford, Phillip, MD  oxyCODONE-acetaminophen (ROXICET) 5-325 MG tablet Take 1-2 tablets by mouth every 4 (four) hours as needed for severe pain. 10/10/15   Beers, Charmayne Sheerharles M, PA-C  predniSONE (STERAPRED UNI-PAK 21 TAB) 10 MG (21) TBPK tablet Take 6 tablets on day 1 Take 5 tablets on day 2 Take 4 tablets on day 3 Take 3 tablets on day 4 Take 2 tablets on day 5 Take 1 tablet on day 6 11/23/16   Triplett, Cari B, FNP  traMADol (ULTRAM) 50 MG tablet Take 1 tablet (50 mg total) by mouth every 12 (twelve) hours as needed. 12/24/17   Joni ReiningSmith, Ronald K, PA-C  esomeprazole (NEXIUM) 40 MG capsule Take 40 mg by mouth daily at 12 noon.  10/10/15  [provider]    Allergies Patient has no known allergies.  No family history on file.  Social History Social History   Tobacco Use  . Smoking status: Current Every Day Smoker  . Smokeless tobacco: Never Used  Substance Use Topics  . Alcohol use: No  . Drug use: No    Review of Systems Constitutional:  No fever/chills Eyes: No visual changes. ENT: No sore throat. Cardiovascular: Denies chest pain. Respiratory: Denies shortness of breath. Gastrointestinal: No abdominal pain.  No nausea, no vomiting.  No diarrhea.  No constipation. Genitourinary: Negative for dysuria. Musculoskeletal: Negative for back pain. Skin: Negative for rash. Neurological: Negative for headaches, focal weakness or numbness.   ____________________________________________   PHYSICAL EXAM:  VITAL SIGNS: ED Triage Vitals [12/24/17 1005]  Enc Vitals Group     BP      Pulse      Resp      Temp      Temp src      SpO2      Weight  280 lb (127 kg)     Height 6' (1.829 m)     Head Circumference      Peak Flow      Pain Score 8     Pain Loc      Pain Edu?      Excl. in GC?    Constitutional: Alert and oriented. Well appearing and in no acute distress. Mouth/Throat: Mucous membranes are moist.  Oropharynx non-erythematous.  Moderate gingival edema and erythema at tooth #29. Neck: No stridor. Hematological/Lymphatic/Immunilogical: No cervical lymphadenopathy. Cardiovascular: Normal rate, regular rhythm. Grossly normal heart sounds.  Good peripheral circulation. Respiratory: Normal respiratory effort.  No retractions. Lungs CTAB. Gastrointestinal: Soft and nontender. No distention. No abdominal bruits. No CVA tenderness. Musculoskeletal: No lower extremity tenderness nor edema.  No joint effusions. Neurologic:  Normal speech and language. No gross focal neurologic deficits are appreciated. No gait instability. Skin:  Skin is warm, dry and intact. No rash noted. Psychiatric: Mood and affect are normal. Speech and behavior are normal.  ____________________________________________   LABS (all labs ordered are listed, but only abnormal results are displayed)  Labs Reviewed - No data to display ____________________________________________  EKG   ____________________________________________  RADIOLOGY  ED MD interpretation:    Official radiology report(s): No results found.  ____________________________________________   PROCEDURES  Procedure(s) performed: None  Procedures  Critical Care performed: No  ____________________________________________   INITIAL IMPRESSION / ASSESSMENT AND PLAN / ED COURSE  As part of my medical decision making, I reviewed the following data within the electronic MEDICAL RECORD NUMBER    Dental pain secondary to infected caries.  Patient given discharge care instruction advised to reschedule his dental appointment.  Take medication as directed.       ____________________________________________   FINAL CLINICAL IMPRESSION(S) / ED DIAGNOSES  Final diagnoses:  Infected dental caries     ED Discharge Orders         Ordered    amoxicillin (AMOXIL) 500 MG capsule  3 times daily     12/24/17 1029    traMADol (ULTRAM) 50 MG tablet  Every 12 hours PRN     12/24/17 1029    ibuprofen (ADVIL,MOTRIN) 600 MG tablet  Every 6 hours PRN     12/24/17 1029           Note:  This document was prepared using Dragon voice recognition software and may include unintentional dictation errors.    Joni Reining, PA-C 12/24/17 1043    Merrily Brittle, MD 12/25/17 0111

## 2017-12-24 NOTE — ED Notes (Signed)
See triage note  States he developed some swelling to left jaw this am he has a tooth which is near the gumline and developed pain last pm after eating

## 2017-12-24 NOTE — ED Triage Notes (Signed)
Pt reports toothache to left lower jaw that started yesterday.

## 2017-12-24 NOTE — Discharge Instructions (Signed)
Advised to reschedule your dental appointment or follow-up from list of dental clinics provided. OPTIONS FOR DENTAL FOLLOW UP CARE  [place] Department of Health and Human Services - Local Safety Net Dental Clinics TripDoors.comhttp://www.ncdhhs.gov/dph/oralhealth/services/safetynetclinics.htm   [facility] [phone])  [facility] [place] [phone])  [facility] [place] [phone]  [facility] [phone])  [facility] [phone]) This clinic caters to the indigent population and is on a lottery system. Location: [employer], [address], [place] [facility] Hours: Wednesdays from 6pm - 9pm, patients seen by a lottery system. For dates, call or go to [email or web address] Services: Cleanings, fillings and simple extractions. Payment Options: DENTAL WORK IS FREE OF CHARGE. Bring proof of income or support. Best way to get seen: Arrive at 5:15 pm - this is a lottery, NOT first come/first serve, so arriving earlier will not increase your chances of being seen.     [facility] [phone] Select option 1 for emergencies   Location: [facility], [address], [place] [facility] Hours: No walk-ins accepted - call the day before to schedule an appointment. Check in times are 9:30 am and 1:30 pm. Services: Simple extractions, temporary fillings, pulpectomy/pulp debridement, uncomplicated abscess drainage. Payment Options: PAYMENT IS DUE AT THE TIME OF SERVICE.  Fee is usually $100-200, additional surgical procedures (e.g. abscess drainage) may be extra. Cash, checks, Visa/MasterCard accepted.  Can file Medicaid if patient is covered for dental - patient should call case worker to check. No discount for [facility] patients. Best way to get seen: MUST call the day before and get onto the schedule. Can usually be seen the  next 1-2 days. No walk-ins accepted.     [facility] [phone]   Location: [facility], [address], [place] [facility] Hours: M, W, Th, F 8am or 1:30pm, Tues 9a or 1:30 - first come/first served. Services: Simple extractions, temporary fillings, uncomplicated abscess drainage.  You do not need to be [place] resident. Payment Options: PAYMENT IS DUE AT THE TIME OF SERVICE. Dental insurance, otherwise sliding scale - bring proof of income or support. Depending on income and treatment needed, cost is usually $50-200. Best way to get seen: Arrive early as it is first come/first served.     [facility] [phone]   Location: [address] Clinic Hours: Mon-Thu 8a-5p Services: Most basic dental services including extractions and fillings. Payment Options: PAYMENT IS DUE AT THE TIME OF SERVICE. Sliding scale, up to 50% off - bring proof if income or support. Medicaid with dental option accepted. Best way to get seen: Call to schedule an appointment, can usually be seen within 2 weeks OR they will try to see walk-ins - show up at 8a or 2p (you may have to wait).     [facility] [phone] [place]IDENTS ONLY   Location: [facility], [address], [place], [place] [address] Clinic Hours: By appointment only. Monday - Thursday 8am-5pm, Friday 8am-12pm Services: Cleanings, fillings, extractions. Payment Options: PAYMENT IS DUE AT THE TIME OF SERVICE. Cash, Visa or MasterCard. Sliding scale - $30 minimum per service. Best way to get seen: Come in to office, complete packet and make an appointment - need proof of income or support monies for each household member and proof of [place] residence. Usually takes about a month to get in.  [facility] [phone]   Location: [address]  [facility] Clinic Hours: Walk-in Urgent Care Dental Services are offered Monday-Friday mornings only. The numbers of emergencies accepted daily is limited to the number of providers available. Maximum 15 - Mondays, Wednesdays & Thursdays Maximum 10 - Tuesdays & Fridays Services: You do not need to be a [facility] resident to be seen for a dental emergency. Emergencies are defined as pain, swelling, abnormal bleeding, or dental trauma. Walkins will receive x-rays if needed. NOTE: Dental cleaning is not an emergency. Payment Options: PAYMENT IS DUE AT THE TIME OF SERVICE. Minimum co-pay is $40.00 for uninsured patients. Minimum co-pay is $3.00 for Medicaid with dental coverage. Dental Insurance is accepted and must be presented at time of visit. Medicare does not cover dental. Forms of payment: Cash, credit card, checks. Best way to get seen: If not previously registered with the clinic, walk-in dental registration begins at 7:15 am and is on a first come/first serve basis. If previously registered with the clinic, call to make an appointment.     [facility] [phone] [place] RESIDENTS ONLY   Location: [address] [place], [place] Clinic Hours: Mon-Thu 10a-2p Services: Extractions only! Payment Options: FREE (donations accepted) - bring proof of income or support Best way to get seen: Call and schedule an appointment OR come at 8am on the 1st Monday of every month (except for holidays) when it is first come/first served.     [facility] [phone]   Location: [address] [place], [place] Clinic Hours: Friday mornings Services, Payment Options, Best way to get seen: Call for info  .

## 2018-01-01 ENCOUNTER — Emergency Department
Admission: EM | Admit: 2018-01-01 | Discharge: 2018-01-01 | Disposition: A | Discharge status: Home / Self Care | Payer: No Typology Code available for payment source | Attending: Emergency Medicine | Admitting: Emergency Medicine

## 2018-01-01 ENCOUNTER — Encounter: Payer: Self-pay | Admitting: Emergency Medicine

## 2018-01-01 ENCOUNTER — Emergency Department: Payer: No Typology Code available for payment source

## 2018-01-01 ENCOUNTER — Other Ambulatory Visit: Payer: Self-pay

## 2018-01-01 DIAGNOSIS — I10 Essential (primary) hypertension: Secondary | ICD-10-CM | POA: Diagnosis not present

## 2018-01-01 DIAGNOSIS — Y939 Activity, unspecified: Secondary | ICD-10-CM | POA: Diagnosis not present

## 2018-01-01 DIAGNOSIS — Y929 Unspecified place or not applicable: Secondary | ICD-10-CM | POA: Diagnosis not present

## 2018-01-01 DIAGNOSIS — S199XXA Unspecified injury of neck, initial encounter: Secondary | ICD-10-CM | POA: Diagnosis present

## 2018-01-01 DIAGNOSIS — Z79899 Other long term (current) drug therapy: Secondary | ICD-10-CM | POA: Insufficient documentation

## 2018-01-01 DIAGNOSIS — S161XXA Strain of muscle, fascia and tendon at neck level, initial encounter: Secondary | ICD-10-CM | POA: Diagnosis not present

## 2018-01-01 DIAGNOSIS — F17200 Nicotine dependence, unspecified, uncomplicated: Secondary | ICD-10-CM | POA: Insufficient documentation

## 2018-01-01 DIAGNOSIS — Y999 Unspecified external cause status: Secondary | ICD-10-CM | POA: Insufficient documentation

## 2018-01-01 DIAGNOSIS — R51 Headache: Secondary | ICD-10-CM | POA: Insufficient documentation

## 2018-01-01 MED ORDER — NAPROXEN 250 MG PO TABS: 250.0000 mg | ORAL_TABLET | Freq: Two times a day (BID) | ORAL | 0 refills | Status: DC
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
## 2018-11-17 ENCOUNTER — Other Ambulatory Visit: Payer: Self-pay

## 2018-11-17 ENCOUNTER — Emergency Department
Admission: EM | Admit: 2018-11-17 | Discharge: 2018-11-18 | Disposition: A | Discharge status: Home / Self Care | Payer: Self-pay | Attending: Emergency Medicine | Admitting: Emergency Medicine

## 2018-11-17 DIAGNOSIS — I10 Essential (primary) hypertension: Secondary | ICD-10-CM | POA: Insufficient documentation

## 2018-11-17 DIAGNOSIS — A0811 Acute gastroenteropathy due to Norwalk agent: Secondary | ICD-10-CM | POA: Insufficient documentation

## 2018-11-17 DIAGNOSIS — A040 Enteropathogenic Escherichia coli infection: Secondary | ICD-10-CM | POA: Insufficient documentation

## 2018-11-17 LAB — URINALYSIS, COMPLETE (UACMP) WITH MICROSCOPIC
Bilirubin Urine: NEGATIVE
Glucose, UA: NEGATIVE mg/dL
Hgb urine dipstick: NEGATIVE
Ketones, ur: NEGATIVE mg/dL
Leukocytes,Ua: NEGATIVE
Nitrite: NEGATIVE
Protein, ur: 30 mg/dL — AB
Specific Gravity, Urine: 1.028 (ref 1.005–1.030)
pH: 5 (ref 5.0–8.0)

## 2018-11-17 LAB — COMPREHENSIVE METABOLIC PANEL
ALT: 28 U/L (ref 0–44)
AST: 20 U/L (ref 15–41)
Albumin: 4.7 g/dL (ref 3.5–5.0)
Alkaline Phosphatase: 118 U/L (ref 38–126)
Anion gap: 10 (ref 5–15)
BUN: 30 mg/dL — ABNORMAL HIGH (ref 6–20)
CO2: 24 mmol/L (ref 22–32)
Calcium: 9.4 mg/dL (ref 8.9–10.3)
Chloride: 102 mmol/L (ref 98–111)
Creatinine, Ser: 1.29 mg/dL — ABNORMAL HIGH (ref 0.61–1.24)
GFR calc Af Amer: >60 mL/min (ref >60)
GFR calc non Af Amer: >60 mL/min (ref >60)
Glucose, Bld: 122 mg/dL — ABNORMAL HIGH (ref 70–99)
Potassium: 3.5 mmol/L (ref 3.5–5.1)
Sodium: 136 mmol/L (ref 135–145)
Total Bilirubin: 0.6 mg/dL (ref 0.3–1.2)
Total Protein: 8.4 g/dL — ABNORMAL HIGH (ref 6.5–8.1)

## 2018-11-17 LAB — LIPASE, BLOOD: Lipase: 31 U/L (ref 11–51)

## 2018-11-17 LAB — CBC
HCT: 53.1 % — ABNORMAL HIGH (ref 39.0–52.0)
Hemoglobin: 18.2 g/dL — ABNORMAL HIGH (ref 13.0–17.0)
MCH: 31.9 pg (ref 26.0–34.0)
MCHC: 34.3 g/dL (ref 30.0–36.0)
MCV: 93 fL (ref 80.0–100.0)
Platelets: 272 10*3/uL (ref 150–400)
RBC: 5.71 MIL/uL (ref 4.22–5.81)
RDW: 14.4 % (ref 11.5–15.5)
WBC: 10.1 10*3/uL (ref 4.0–10.5)
nRBC: 0 % (ref 0.0–0.2)

## 2018-11-17 MED ORDER — SODIUM CHLORIDE 0.9 % IV BOLUS
1000.0000 mL | Freq: Once | INTRAVENOUS | Status: AC
  Administered 2018-11-18: 1000 mL via INTRAVENOUS

## 2018-11-17 MED ORDER — ONDANSETRON HCL 4 MG/2ML IJ SOLN
4.0000 mg | Freq: Once | INTRAMUSCULAR | Status: AC
  Administered 2018-11-18: 4 mg via INTRAVENOUS
  Filled 2018-11-17: qty 2

## 2018-11-17 MED ORDER — SODIUM CHLORIDE 0.9 % IV BOLUS
1000.0000 mL | Freq: Once | INTRAVENOUS | Status: AC
  Administered 2018-11-17: 1000 mL via INTRAVENOUS

## 2018-11-17 NOTE — ED Provider Notes (Signed)
[facility] Emergency Department Provider Note __________   First MD Initiated Contact with Patient 11/17/18 2310     (approximate)  I have reviewed the triage vital signs and the nursing notes.   HISTORY  Chief Complaint Nausea, Emesis, and Diarrhea    HPI Jim [name] is a 38 y.o. male with below list of previous medical conditions presents to the emergency department with a 1 day history of copious episodes of nonbloody watery diarrhea.  Patient admits to multiple episodes of vomiting yesterday however states that he has not vomited today.  Patient denies any abdominal pain no fever no blood noted in the stool.        Past Medical History:  Diagnosis Date  . Diverticulitis   . GERD (gastroesophageal reflux disease)   . Hypertension     There are no active problems to display for this patient.   No past surgical history on file.  Prior to Admission medications   Medication Sig Start Date End Date Taking? Authorizing Provider  amoxicillin (AMOXIL) 500 MG capsule Take 1 capsule (500 mg total) by mouth 3 (three) times daily. 12/24/17   Sable Feil, PA-C  amoxicillin (AMOXIL) 500 MG tablet Take 1 tablet (500 mg total) by mouth 2 (two) times daily. 12/19/16   Little, Traci M, PA-C  amoxicillin (AMOXIL) 500 MG tablet Take 500 mg by mouth 2 (two) times daily.    [provider]  ciprofloxacin (CIPRO) 500 MG tablet Take 1 tablet (500 mg total) by mouth 2 (two) times daily. 04/30/16   Carrie Mew, MD  diphenhydrAMINE (BENADRYL) 25 mg capsule Take 1 capsule (25 mg total) by mouth every 4 (four) hours as needed. 11/23/16 11/23/17  Triplett, Johnette Abraham B, FNP  ibuprofen (ADVIL,MOTRIN) 600 MG tablet Take 1 tablet (600 mg total) by mouth every 6 (six) hours as needed. 12/24/17   Sable Feil, PA-C  ibuprofen (ADVIL,MOTRIN) 800 MG tablet Take 1 tablet (800 mg total) by mouth every 8 (eight) hours as needed. 10/10/15   Beers, Pierce Crane, PA-C   metroNIDAZOLE (FLAGYL) 500 MG tablet Take 1 tablet (500 mg total) by mouth 3 (three) times daily. 04/30/16   Carrie Mew, MD  naproxen (NAPROSYN) 250 MG tablet Take 1 tablet (250 mg total) by mouth 2 (two) times daily with a meal. 01/01/18   Schuyler Amor, MD  ondansetron (ZOFRAN ODT) 4 MG disintegrating tablet Take 1 tablet (4 mg total) by mouth every 8 (eight) hours as needed for nausea or vomiting. 04/30/16   Carrie Mew, MD  oxyCODONE-acetaminophen (ROXICET) 5-325 MG tablet Take 1-2 tablets by mouth every 4 (four) hours as needed for severe pain. 10/10/15   Beers, Pierce Crane, PA-C  predniSONE (STERAPRED UNI-PAK 21 TAB) 10 MG (21) TBPK tablet Take 6 tablets on day 1 Take 5 tablets on day 2 Take 4 tablets on day 3 Take 3 tablets on day 4 Take 2 tablets on day 5 Take 1 tablet on day 6 11/23/16   Triplett, Cari B, FNP  traMADol (ULTRAM) 50 MG tablet Take 1 tablet (50 mg total) by mouth every 12 (twelve) hours as needed. 12/24/17   Sable Feil, PA-C    Allergies Patient has no known allergies.  No family history on file.  Social History Social History   Tobacco Use  . Smoking status: Current Every Day Smoker  . Smokeless tobacco: Never Used  Substance Use Topics  . Alcohol use: No  . Drug use: No  Review of Systems Constitutional: No fever/chills Eyes: No visual changes. ENT: No sore throat. Cardiovascular: Denies chest pain. Respiratory: Denies shortness of breath. Gastrointestinal: No abdominal pain.  Positive for nausea vomiting or diarrhea.   Genitourinary: Negative for dysuria. Musculoskeletal: Negative for neck pain.  Negative for back pain. Integumentary: Negative for rash. Neurological: Negative for headaches, focal weakness or numbness.   ____________________________________________   PHYSICAL EXAM:  VITAL SIGNS: ED Triage Vitals  Enc Vitals Group     BP 11/17/18 1727 (!) 141/86     Pulse Rate 11/17/18 1727 (!) 112     Resp 11/17/18 1727 18      Temp 11/17/18 1727 98.7 F (37.1 C)     Temp src --      SpO2 11/17/18 1727 97 %     Weight 11/17/18 1733 81.6 kg (180 lb)     Height 11/17/18 1733 1.854 m (6\' 1" )     Head Circumference --      Peak Flow --      Pain Score 11/17/18 1732 6     Pain Loc --      Pain Edu? --      Excl. in GC? --     Constitutional: Alert and oriented. Well appearing and in no acute distress. Eyes: Conjunctivae are normal.  Mouth/Throat: Mucous membranes are DRY.  Oropharynx non-erythematous. Neck: No stridor.   Cardiovascular: Normal rate, regular rhythm. Good peripheral circulation. Grossly normal heart sounds. Respiratory: Normal respiratory effort.  No retractions. No audible wheezing. Gastrointestinal: Soft and nontender. No distention.  Musculoskeletal: No lower extremity tenderness nor edema. No gross deformities of extremities. Neurologic:  Normal speech and language. No gross focal neurologic deficits are appreciated.  Skin:  Skin is warm, dry and intact. No rash noted. Psychiatric: Mood and affect are normal. Speech and behavior are normal.  ____________________________________________   LABS (all labs ordered are listed, but only abnormal results are displayed)  Labs Reviewed  COMPREHENSIVE METABOLIC PANEL - Abnormal; Notable for the following components:      Result Value   Glucose, Bld 122 (*)    BUN 30 (*)    Creatinine, Ser 1.29 (*)    Total Protein 8.4 (*)    All other components within normal limits  CBC - Abnormal; Notable for the following components:   Hemoglobin 18.2 (*)    HCT 53.1 (*)    All other components within normal limits  URINALYSIS, COMPLETE (UACMP) WITH MICROSCOPIC - Abnormal; Notable for the following components:   Color, Urine AMBER (*)    APPearance HAZY (*)    Protein, ur 30 (*)    Bacteria, UA RARE (*)    All other components within normal limits  GASTROINTESTINAL PANEL BY PCR, STOOL (REPLACES STOOL CULTURE)  C DIFFICILE QUICK SCREEN W PCR  REFLEX  LIPASE, BLOOD     Procedures   ____________________________________________   INITIAL IMPRESSION / MDM / ASSESSMENT AND PLAN / ED COURSE  As part of my medical decision making, I reviewed the following data within the electronic MEDICAL RECORD NUMBER   38 year old male presenting with above-stated history and physical exam secondary to diarrhea and vomiting.  Suspect infectious etiology for the patient's symptoms and as such stool samples were obtained which were positive for enteropathogenic E. coli as well as norovirus.  Patient given 2 L IV normal saline in the emergency department as well as IV Zofran and p.o. Imodium.  Patient be prescribed Zofran for home and advised to take Imodium  over-the-counter.  Patient also given azithromycin given severity of diarrheal illness  *Harriett RushRusty W Freida Busmanllen was evaluated in Emergency Department on 11/17/2018 for the symptoms described in the history of present illness. He was evaluated in the context of the global COVID-19 pandemic, which necessitated consideration that the patient might be at risk for infection with the SARS-CoV-2 virus that causes COVID-19. Institutional protocols and algorithms that pertain to the evaluation of patients at risk for COVID-19 are in a state of rapid change based on information released by regulatory bodies including the CDC and federal and state organizations. These policies and algorithms were followed during the patient's care in the ED.  Some ED evaluations and interventions may be delayed as a result of limited staffing during the pandemic.*        ____________________________________________  FINAL CLINICAL IMPRESSION(S) / ED DIAGNOSES  Final diagnoses:  Enteritis, enteropathogenic E. coli  Norovirus     MEDICATIONS GIVEN DURING THIS VISIT:  Medications  ondansetron (ZOFRAN) injection 4 mg (has no administration in time range)  sodium chloride 0.9 % bolus 1,000 mL (has no administration in time range)   sodium chloride 0.9 % bolus 1,000 mL (has no administration in time range)     ED Discharge Orders    None       Note:  This document was prepared using Dragon voice recognition software and may include unintentional dictation errors.   Darci CurrentBrown, Barron N, MD 11/18/18 [phone]

## 2018-11-17 NOTE — ED Triage Notes (Signed)
Pt arrives to ed via POV, ambulatory to triage, c/o N/V/D starting yesterday around 1pm. Emesis x 9, diarrhea x 15 since starting. Pt states " I just feel weak like I don't have the energy I used to".  Pt states unable to keep any fluids down. NAD noted at this time

## 2018-11-18 LAB — GASTROINTESTINAL PANEL BY PCR, STOOL (REPLACES STOOL CULTURE)

## 2018-11-18 LAB — C DIFFICILE QUICK SCREEN W PCR REFLEX??
C Diff antigen: NEGATIVE
C Diff toxin: NEGATIVE

## 2018-11-18 LAB — C DIFFICILE QUICK SCREEN W PCR REFLEX: C Diff interpretation: NOT DETECTED

## 2018-11-18 MED ORDER — AZITHROMYCIN 250 MG PO TABS: ORAL_TABLET | ORAL | 0 refills | Status: AC

## 2018-11-18 MED ORDER — ONDANSETRON 4 MG PO TBDP: 4.0000 mg | ORAL_TABLET | Freq: Three times a day (TID) | ORAL | 0 refills | Status: DC | PRN

## 2018-11-18 MED ORDER — LOPERAMIDE HCL 2 MG PO CAPS
4.0000 mg | ORAL_CAPSULE | Freq: Once | ORAL | Status: AC
  Administered 2018-11-18: 4 mg via ORAL
  Filled 2018-11-18: qty 2

## 2019-03-12 ENCOUNTER — Emergency Department
Admission: EM | Admit: 2019-03-12 | Discharge: 2019-03-12 | Disposition: A | Discharge status: Home / Self Care | Payer: Managed Care, Other (non HMO) | Attending: Emergency Medicine | Admitting: Emergency Medicine

## 2019-03-12 ENCOUNTER — Other Ambulatory Visit: Payer: Self-pay

## 2019-03-12 ENCOUNTER — Encounter: Payer: Self-pay | Admitting: Emergency Medicine

## 2019-03-12 DIAGNOSIS — I10 Essential (primary) hypertension: Secondary | ICD-10-CM | POA: Diagnosis not present

## 2019-03-12 DIAGNOSIS — H6501 Acute serous otitis media, right ear: Secondary | ICD-10-CM | POA: Insufficient documentation

## 2019-03-12 DIAGNOSIS — F17210 Nicotine dependence, cigarettes, uncomplicated: Secondary | ICD-10-CM | POA: Diagnosis not present

## 2019-03-12 DIAGNOSIS — H9191 Unspecified hearing loss, right ear: Secondary | ICD-10-CM | POA: Diagnosis present

## 2019-03-12 MED ORDER — FLUTICASONE PROPIONATE 50 MCG/ACT NA SUSP: 2.0000 | Freq: Every day | NASAL | 0 refills | Status: DC

## 2019-03-12 MED ORDER — CETIRIZINE HCL 5 MG PO TABS: 5.0000 mg | ORAL_TABLET | Freq: Every day | ORAL | 0 refills | Status: DC

## 2019-03-12 MED ORDER — PREDNISONE 20 MG PO TABS: 20.0000 mg | ORAL_TABLET | Freq: Two times a day (BID) | ORAL | 0 refills | Status: AC

## 2019-03-12 NOTE — ED Notes (Signed)
No peripheral IV placed this visit.   Discharge instructions reviewed with patient. Questions fielded by this RN. Patient verbalizes understanding of instructions. Patient discharged home in stable condition per Jenise, PA. No acute distress noted at time of discharge.   

## 2019-03-12 NOTE — ED Provider Notes (Signed)
[facility] Emergency Department Provider Note ____________________________________________  Time seen: 1945  I have reviewed the triage vital signs and the nursing notes.  HISTORY  Chief Complaint  Hearing Problem  HPI Jim [name] is a 38 y.o. male presents to the ED for evaluation of decreased hearing on the right.  He denies any headache, nausea, vomiting, dizziness, tinnitus, vertigo, or syncope.   He reports he did have a upper respiratory infection about 2 weeks earlier.  He presents with some decreased hearing on the right as well as sensation of fluid in the ear.  He denies any other symptoms at this time.  He reports that he has had similar episodes in the past, has been previously treated with antibiotics on each presentation.  Past Medical History:  Diagnosis Date  . Diverticulitis   . GERD (gastroesophageal reflux disease)   . Hypertension     There are no active problems to display for this patient.   History reviewed. No pertinent surgical history.  Prior to Admission medications   Medication Sig Start Date End Date Taking? Authorizing Provider  cetirizine (ZYRTEC) 5 MG tablet Take 1 tablet (5 mg total) by mouth daily. 03/12/19 04/11/19  Zaynah Chawla, Charlesetta Ivory, PA-C  fluticasone (FLONASE) 50 MCG/ACT nasal spray Place 2 sprays into both nostrils daily. 03/12/19   Morgane Joerger, Charlesetta Ivory, PA-C  predniSONE (DELTASONE) 20 MG tablet Take 1 tablet (20 mg total) by mouth 2 (two) times daily with a meal for 5 days. 03/12/19 03/17/19  Mang Hazelrigg, Charlesetta Ivory, PA-C  diphenhydrAMINE (BENADRYL) 25 mg capsule Take 1 capsule (25 mg total) by mouth every 4 (four) hours as needed. 11/23/16 03/12/19  Chinita Pester, FNP    Allergies Patient has no known allergies.  No family history on file.  Social History Social History   Tobacco Use  . Smoking status: Current Every Day Smoker  . Smokeless tobacco: Never Used  Substance Use Topics  . Alcohol use:  No  . Drug use: No    Review of Systems  Constitutional: Negative for fever. Eyes: Negative for visual changes. ENT: Negative for sore throat.  Decreased hearing on the right. Cardiovascular: Negative for chest pain. Respiratory: Negative for shortness of breath. Gastrointestinal: Negative for abdominal pain, vomiting and diarrhea. Genitourinary: Negative for dysuria. Musculoskeletal: Negative for back pain. Skin: Negative for rash. Neurological: Negative for headaches, focal weakness or numbness. ____________________________________________  PHYSICAL EXAM:  VITAL SIGNS: ED Triage Vitals [03/12/19 1713]  Enc Vitals Group     BP (!) 160/100     Pulse Rate (!) 105     Resp 20     Temp 99.2 F (37.3 C)     Temp src      SpO2 97 %     Weight 240 lb (108.9 kg)     Height 6' (1.829 m)     Head Circumference      Peak Flow      Pain Score 0     Pain Loc      Pain Edu?      Excl. in GC?     Constitutional: Alert and oriented. Well appearing and in no distress. Head: Normocephalic and atraumatic. Eyes: Conjunctivae are normal. PERRL. Normal extraocular movements Ears: Canals clear. TMs intact bilaterally.  The right TM is slightly injected, and shows a serous effusion.  The left TM is without any effusion and is intact. Nose: No congestion/rhinorrhea/epistaxis. Mouth/Throat: Mucous membranes are moist. Neck: Supple. No thyromegaly.  Hematological/Lymphatic/Immunological: No cervical lymphadenopathy. Cardiovascular: Normal rate, regular rhythm. Normal distal pulses. Respiratory: Normal respiratory effort. No wheezes/rales/rhonchi. Gastrointestinal: Soft and nontender. No distention. Musculoskeletal: Nontender with normal range of motion in all extremities.  Neurologic:  Normal gait without ataxia. Normal speech and language. No gross focal neurologic deficits are appreciated. Skin:  Skin is warm, dry and intact. No rash noted. Psychiatric: Mood and affect are normal.  Patient exhibits appropriate insight and judgment. ____________________________________________  PROCEDURES  Procedures ____________________________________________  INITIAL IMPRESSION / ASSESSMENT AND PLAN / ED COURSE  Patient presents to the ED with right ear complaints.  He reports some decreased hearing and fluid sensation in the ear.  Clinical patient confirms a serous effusion without signs of acute AOM.  Patient be discharged with prescriptions for prednisone, Flonase, and Zyrtec.  He is advised in self-limited course of a serous effusion, but may follow-up with ENT as needed.  Return precautions have been reviewed.  Jim [name] was evaluated in Emergency Department on 03/12/2019 for the symptoms described in the history of present illness. He was evaluated in the context of the global COVID-19 pandemic, which necessitated consideration that the patient might be at risk for infection with the SARS-CoV-2 virus that causes COVID-19. Institutional protocols and algorithms that pertain to the evaluation of patients at risk for COVID-19 are in a state of rapid change based on information released by regulatory bodies including the CDC and federal and state organizations. These policies and algorithms were followed during the patient's care in the ED. ____________________________________________  FINAL CLINICAL IMPRESSION(S) / ED DIAGNOSES  Final diagnoses:  Non-recurrent acute serous otitis media of right ear      Melvenia Needles, PA-C 03/12/19 Monico Blitz, MD 03/14/19 [phone]

## 2019-03-12 NOTE — ED Triage Notes (Signed)
Decreased hearing R ear x 1 week. Regarding blood pressure, states off meds x 1 week, getting prescription tomorrow.

## 2019-03-12 NOTE — Discharge Instructions (Signed)
You have a Serous Otitis Media, which means you have fluid, not pus, behind the ear drum. This is not an infection, but usually comes along after a cold or allergy symptoms. It will resolve with allergy medicine, nasal spray, and steroids. Follow-up with your provider or [facility] for ongoing symptoms.

## 2019-07-05 ENCOUNTER — Encounter: Payer: Self-pay | Admitting: Medical Oncology

## 2019-07-05 ENCOUNTER — Emergency Department
Admission: EM | Admit: 2019-07-05 | Discharge: 2019-07-05 | Disposition: A | Discharge status: Home / Self Care | Payer: Managed Care, Other (non HMO) | Attending: Emergency Medicine | Admitting: Emergency Medicine

## 2019-07-05 ENCOUNTER — Other Ambulatory Visit: Payer: Self-pay

## 2019-07-05 DIAGNOSIS — K029 Dental caries, unspecified: Secondary | ICD-10-CM | POA: Diagnosis not present

## 2019-07-05 DIAGNOSIS — K047 Periapical abscess without sinus: Secondary | ICD-10-CM | POA: Insufficient documentation

## 2019-07-05 DIAGNOSIS — I10 Essential (primary) hypertension: Secondary | ICD-10-CM | POA: Diagnosis not present

## 2019-07-05 DIAGNOSIS — K0889 Other specified disorders of teeth and supporting structures: Secondary | ICD-10-CM | POA: Diagnosis present

## 2019-07-05 DIAGNOSIS — Z79899 Other long term (current) drug therapy: Secondary | ICD-10-CM | POA: Insufficient documentation

## 2019-07-05 DIAGNOSIS — F17210 Nicotine dependence, cigarettes, uncomplicated: Secondary | ICD-10-CM | POA: Insufficient documentation

## 2019-07-05 MED ORDER — AMOXICILLIN 500 MG PO CAPS: 500.0000 mg | ORAL_CAPSULE | Freq: Three times a day (TID) | ORAL | 0 refills | Status: DC

## 2019-07-05 MED ORDER — IBUPROFEN 600 MG PO TABS: 600.0000 mg | ORAL_TABLET | Freq: Three times a day (TID) | ORAL | 0 refills | Status: DC | PRN

## 2019-07-05 MED ORDER — TRAMADOL HCL 50 MG PO TABS: 50.0000 mg | ORAL_TABLET | Freq: Four times a day (QID) | ORAL | 0 refills | Status: DC | PRN

## 2019-07-05 MED ORDER — LIDOCAINE VISCOUS HCL 2 % MT SOLN
15.0000 mL | Freq: Once | OROMUCOSAL | Status: AC
  Administered 2019-07-05: 11:00:00 15 mL via OROMUCOSAL
  Filled 2019-07-05: qty 15

## 2019-07-05 NOTE — ED Notes (Signed)
See triage note  Presents with right side dental pain  States pain started couple of days ago

## 2019-07-05 NOTE — ED Provider Notes (Signed)
[facility] Emergency Department Provider Note   ____________________________________________   First MD Initiated Contact with Patient 07/05/19 1051     (approximate)  I have reviewed the triage vital signs and the nursing notes.   HISTORY  Chief Complaint Dental Pain    HPI Jim [name] is a 39 y.o. male patient presents with right lower molar dental pain for few days.  Patient has a history of devitalized teeth.  Patient is a scheduled to see a dentist early next month.  Patient rates the pain as a 9 out of 10.  Patient described the pain as "achy".  No palliative measure for complaint.         Past Medical History:  Diagnosis Date  . Diverticulitis   . GERD (gastroesophageal reflux disease)   . Hypertension     There are no problems to display for this patient.   History reviewed. No pertinent surgical history.  Prior to Admission medications   Medication Sig Start Date End Date Taking? Authorizing Provider  amoxicillin (AMOXIL) 500 MG capsule Take 1 capsule (500 mg total) by mouth 3 (three) times daily. 07/05/19   Joni Reining, PA-C  cetirizine (ZYRTEC) 5 MG tablet Take 1 tablet (5 mg total) by mouth daily. 03/12/19 04/11/19  Menshew, Charlesetta Ivory, PA-C  fluticasone (FLONASE) 50 MCG/ACT nasal spray Place 2 sprays into both nostrils daily. 03/12/19   Menshew, Charlesetta Ivory, PA-C  ibuprofen (ADVIL) 600 MG tablet Take 1 tablet (600 mg total) by mouth every 8 (eight) hours as needed. 07/05/19   Joni Reining, PA-C  traMADol (ULTRAM) 50 MG tablet Take 1 tablet (50 mg total) by mouth every 6 (six) hours as needed. 07/05/19 07/04/20  Joni Reining, PA-C  diphenhydrAMINE (BENADRYL) 25 mg capsule Take 1 capsule (25 mg total) by mouth every 4 (four) hours as needed. 11/23/16 03/12/19  Chinita Pester, FNP    Allergies Patient has no known allergies.  No family history on file.  Social History Social History   Tobacco Use  . Smoking  status: Current Every Day Smoker  . Smokeless tobacco: Never Used  Substance Use Topics  . Alcohol use: No  . Drug use: No    Review of Systems Constitutional: No fever/chills Eyes: No visual changes. ENT: No sore throat.  Dental pain. Cardiovascular: Denies chest pain. Respiratory: Denies shortness of breath. Gastrointestinal: No abdominal pain.  No nausea, no vomiting.  No diarrhea.  No constipation. Genitourinary: Negative for dysuria. Musculoskeletal: Negative for back pain. Skin: Negative for rash. Neurological: Negative for headaches, focal weakness or numbness. Endocrine:  Hypertension     ____________________________________________   PHYSICAL EXAM:  VITAL SIGNS: ED Triage Vitals  Enc Vitals Group     BP 07/05/19 1042 (!) 167/104     Pulse Rate 07/05/19 1042 96     Resp 07/05/19 1042 16     Temp 07/05/19 1042 98.8 F (37.1 C)     Temp Source 07/05/19 1042 Oral     SpO2 07/05/19 1042 98 %     Weight 07/05/19 1039 225 lb (102.1 kg)     Height 07/05/19 1039 6' (1.829 m)     Head Circumference --      Peak Flow --      Pain Score 07/05/19 1039 9     Pain Loc --      Pain Edu? --      Excl. in [place]? --    Constitutional: Alert  and oriented. Well appearing and in no acute distress. Mouth/Throat: Mucous membranes are moist.  Oropharynx non-erythematous.  Multiple caries and devitalized teeth. Neck: No stridor.  Hematological/Lymphatic/Immunilogical: No cervical lymphadenopathy. Cardiovascular: Normal rate, regular rhythm. Grossly normal heart sounds.  Good peripheral circulation.  Elevated blood pressure. Respiratory: Normal respiratory effort.  No retractions. Lungs CTAB. Skin:  Skin is warm, dry and intact. No rash noted. Psychiatric: Mood and affect are normal. Speech and behavior are normal.  ____________________________________________   LABS (all labs ordered are listed, but only abnormal results are displayed)  Labs Reviewed - No data to  display ____________________________________________  EKG   ____________________________________________  RADIOLOGY  ED MD interpretation:    Official radiology report(s): No results found.  ____________________________________________   PROCEDURES  Procedure(s) performed (including Critical Care):  Procedures   ____________________________________________   INITIAL IMPRESSION / ASSESSMENT AND PLAN / ED COURSE  As part of my medical decision making, I reviewed the following data within the [place]     Patient presents with few days of dental pain.  Patient has a history of devitalized teeth and multiple caries.  Patient given discharge care instruction advised take medication as directed.  Patient vies follow-up scheduled dental appointment next month.    Jim [name] was evaluated in Emergency Department on 07/05/2019 for the symptoms described in the history of present illness. He was evaluated in the context of the global COVID-19 pandemic, which necessitated consideration that the patient might be at risk for infection with the SARS-CoV-2 virus that causes COVID-19. Institutional protocols and algorithms that pertain to the evaluation of patients at risk for COVID-19 are in a state of rapid change based on information released by regulatory bodies including the CDC and federal and state organizations. These policies and algorithms were followed during the patient's care in the ED.       ____________________________________________   FINAL CLINICAL IMPRESSION(S) / ED DIAGNOSES  Final diagnoses:  Infected dental caries  Dental abscess     ED Discharge Orders         Ordered    amoxicillin (AMOXIL) 500 MG capsule  3 times daily     07/05/19 1101    traMADol (ULTRAM) 50 MG tablet  Every 6 hours PRN     07/05/19 1101    ibuprofen (ADVIL) 600 MG tablet  Every 8 hours PRN     07/05/19 1101           Note:  This document was prepared  using Dragon voice recognition software and may include unintentional dictation errors.    Sable Feil, PA-C 07/05/19 1106    Carrie Mew, MD 07/05/19 [phone]

## 2019-07-05 NOTE — Discharge Instructions (Signed)
Follow discharge care instruction take medication as directed.  Follow-up with scheduled dental appointment next month.

## 2019-07-05 NOTE — ED Triage Notes (Signed)
Pt reports rt sided dental pain for a few days.

## 2019-09-28 ENCOUNTER — Emergency Department
Admission: EM | Admit: 2019-09-28 | Discharge: 2019-09-28 | Disposition: A | Discharge status: Home / Self Care | Payer: Managed Care, Other (non HMO) | Attending: Student in an Organized Health Care Education/Training Program | Admitting: Student in an Organized Health Care Education/Training Program

## 2019-09-28 ENCOUNTER — Encounter: Payer: Self-pay | Admitting: Emergency Medicine

## 2019-09-28 ENCOUNTER — Other Ambulatory Visit: Payer: Self-pay

## 2019-09-28 DIAGNOSIS — K0381 Cracked tooth: Secondary | ICD-10-CM | POA: Insufficient documentation

## 2019-09-28 DIAGNOSIS — I10 Essential (primary) hypertension: Secondary | ICD-10-CM | POA: Insufficient documentation

## 2019-09-28 DIAGNOSIS — F17200 Nicotine dependence, unspecified, uncomplicated: Secondary | ICD-10-CM | POA: Diagnosis not present

## 2019-09-28 DIAGNOSIS — K0889 Other specified disorders of teeth and supporting structures: Secondary | ICD-10-CM | POA: Diagnosis present

## 2019-09-28 DIAGNOSIS — Z79899 Other long term (current) drug therapy: Secondary | ICD-10-CM | POA: Diagnosis not present

## 2019-09-28 MED ORDER — KETOROLAC TROMETHAMINE 30 MG/ML IJ SOLN
30.0000 mg | Freq: Once | INTRAMUSCULAR | Status: AC
  Administered 2019-09-28: 21:00:00 30 mg via INTRAMUSCULAR
  Filled 2019-09-28: qty 1

## 2019-09-28 MED ORDER — AMOXICILLIN 875 MG PO TABS: 875.0000 mg | ORAL_TABLET | Freq: Two times a day (BID) | ORAL | 0 refills | Status: AC

## 2019-09-28 MED ORDER — AMOXICILLIN 500 MG PO CAPS
500.0000 mg | ORAL_CAPSULE | Freq: Once | ORAL | Status: AC
  Administered 2019-09-28: 500 mg via ORAL
  Filled 2019-09-28: qty 1

## 2019-09-28 MED ORDER — KETOROLAC TROMETHAMINE 10 MG PO TABS: 10.0000 mg | ORAL_TABLET | Freq: Four times a day (QID) | ORAL | 0 refills | Status: AC | PRN

## 2019-09-28 NOTE — ED Triage Notes (Signed)
Patient ambulatory to triage with steady gait, without difficulty or distress noted, mask in place; pt reports rt lower dental pain x 2 days

## 2019-09-28 NOTE — ED Notes (Signed)
Pt verbalizes understanding of d/c instructions, denies questions or concerns at this time.  States he is following up with his PCP regarding BP and getting back on BP medication regimen

## 2019-09-28 NOTE — ED Provider Notes (Signed)
Emergency Department Provider Note  ____________________________________________  Time seen: Approximately 9:01 PM  I have reviewed the triage vital signs and the nursing notes.   HISTORY  Chief Complaint Dental Pain   Historian Patient     HPI Jim [name] is a 39 y.o. male presents to the emergency department with inferior 30 pain.  Patient states that he has had a dental abscess in a similar location approximately 1 year ago.  He was seen and evaluated by a local dentist who recommended extraction but states that his blood pressure was unmanaged at the time and they could not conduct procedure.  Patient reports that his symptoms improved after antibiotic.  No fever or chills at home.  Patient has noticed some mild swelling of the right lower jaw.  No pain to palpation underneath the tongue.  No difficulty swallowing.    Past Medical History:  Diagnosis Date  . Diverticulitis   . GERD (gastroesophageal reflux disease)   . Hypertension      Immunizations up to date:  Yes.     Past Medical History:  Diagnosis Date  . Diverticulitis   . GERD (gastroesophageal reflux disease)   . Hypertension     There are no problems to display for this patient.   History reviewed. No pertinent surgical history.  Prior to Admission medications   Medication Sig Start Date End Date Taking? Authorizing Provider  amoxicillin (AMOXIL) 875 MG tablet Take 1 tablet (875 mg total) by mouth 2 (two) times daily for 10 days. 09/28/19 10/08/19  Orvil Feil, PA-C  cetirizine (ZYRTEC) 5 MG tablet Take 1 tablet (5 mg total) by mouth daily. 03/12/19 04/11/19  Menshew, Charlesetta Ivory, PA-C  fluticasone (FLONASE) 50 MCG/ACT nasal spray Place 2 sprays into both nostrils daily. 03/12/19   Menshew, Charlesetta Ivory, PA-C  ibuprofen (ADVIL) 600 MG tablet Take 1 tablet (600 mg total) by mouth every 8 (eight) hours as needed. 07/05/19   Joni Reining, PA-C  traMADol (ULTRAM) 50 MG tablet Take 1 tablet  (50 mg total) by mouth every 6 (six) hours as needed. 07/05/19 07/04/20  Joni Reining, PA-C  diphenhydrAMINE (BENADRYL) 25 mg capsule Take 1 capsule (25 mg total) by mouth every 4 (four) hours as needed. 11/23/16 03/12/19  Chinita Pester, FNP    Allergies Patient has no known allergies.  No family history on file.  Social History Social History   Tobacco Use  . Smoking status: Current Every Day Smoker  . Smokeless tobacco: Never Used  Substance Use Topics  . Alcohol use: No  . Drug use: No     Review of Systems  Constitutional: No fever/chills Eyes:  No discharge ENT: Patient has swelling of right lower jaw. Respiratory: no cough. No SOB/ use of accessory muscles to breath Gastrointestinal:   No nausea, no vomiting.  No diarrhea.  No constipation. Musculoskeletal: Negative for musculoskeletal pain. Skin: Negative for rash, abrasions, lacerations, ecchymosis.  ____________________________________________   PHYSICAL EXAM:  VITAL SIGNS: ED Triage Vitals  Enc Vitals Group     BP 09/28/19 2038 (!) 189/100     Pulse Rate 09/28/19 2038 97     Resp 09/28/19 2038 18     Temp 09/28/19 2038 98.6 F (37 C)     Temp Source 09/28/19 2038 Oral     SpO2 09/28/19 2038 98 %     Weight 09/28/19 2039 220 lb (99.8 kg)     Height 09/28/19 2039 6' (1.829 m)  Head Circumference --      Peak Flow --      Pain Score 09/28/19 2041 8     Pain Loc --      Pain Edu? --      Excl. in [place]? --      Constitutional: Alert and oriented. Well appearing and in no acute distress. Eyes: Conjunctivae are normal. PERRL. EOMI. Head: Atraumatic. ENT:      Nose: No congestion/rhinnorhea.      Mouth/Throat: Mucous membranes are moist.  Patient has broken inferior 30.  Mild swelling of the right lower jaw visualized.  No pain to palpation underneath the tongue. Cardiovascular: Normal rate, regular rhythm. Normal S1 and S2.  Good peripheral circulation. Respiratory: Normal respiratory effort  without tachypnea or retractions. Lungs CTAB. Good air entry to the bases with no decreased or absent breath sounds  Skin:  Skin is warm, dry and intact. No rash noted. Psychiatric: Mood and affect are normal for age. Speech and behavior are normal.   ____________________________________________   LABS (all labs ordered are listed, but only abnormal results are displayed)  Labs Reviewed - No data to display ____________________________________________  EKG   ____________________________________________  RADIOLOGY   No results found.  ____________________________________________    PROCEDURES  Procedure(s) performed:     Procedures     Medications  ketorolac (TORADOL) 30 MG/ML injection 30 mg (has no administration in time range)  amoxicillin (AMOXIL) capsule 500 mg (has no administration in time range)     ____________________________________________   INITIAL IMPRESSION / ASSESSMENT AND PLAN / ED COURSE  Pertinent labs & imaging results that were available during my care of the patient were reviewed by me and considered in my medical decision making (see chart for details).      Assessment and plan Dental pain 39 year old male presents to the emergency department with swelling of the right lower jaw and dental pain surrounding.  On physical exam, inferior 30 was broken.  No pain to palpation underneath the tongue.  Patient was given an injection of Toradol and was started on amoxicillin.  He was advised to make an appointment with a local dentist as soon as possible.  Dental resources were provided in patient's discharge paperwork.     ____________________________________________  FINAL CLINICAL IMPRESSION(S) / ED DIAGNOSES  Final diagnoses:  Pain, dental      NEW MEDICATIONS STARTED DURING THIS VISIT:  ED Discharge Orders         Ordered    amoxicillin (AMOXIL) 875 MG tablet  2 times daily     09/28/19 2105              This chart  was dictated using voice recognition software/Dragon. Despite best efforts to proofread, errors can occur which can change the meaning. Any change was purely unintentional.     Karren Cobble 09/28/19 2105    Merlyn Lot, MD 09/28/19 2129

## 2019-09-28 NOTE — Discharge Instructions (Signed)
OPTIONS FOR DENTAL FOLLOW UP CARE ° °[place] Department of Health and Human Services - Local Safety Net Dental Clinics °http://www.ncdhhs.gov/dph/oralhealth/services/safetynetclinics.htm °  °[facility] (336-562-3123) ° °[facility] ([phone]) ° °[facility] ([phone] ext [phone]) ° °[facility] ([phone]) ° °[facility] ([phone]) °This clinic caters to the indigent population and is on a lottery system. °Location: °[facility], [address], [place] °Clinic Hours: °Wednesdays from 6pm - 9pm, patients seen by a lottery system. °For dates, call or go to [email or web address]/[facility]/patients/[email or web address] °Services: °Cleanings, fillings and simple extractions. °Payment Options: °DENTAL WORK IS FREE OF CHARGE. Bring proof of income or support. °Best way to get seen: °Arrive at 5:15 pm - this is a lottery, NOT first come/first serve, so arriving earlier will not increase your chances of being seen. °  °  °[facility] °[phone] °Select option 1 for emergencies °  °Location: °[facility], [address], [place] °Clinic Hours: °No walk-ins accepted - call the day before to schedule an appointment. °Check in times are 9:30 am and 1:30 pm. °Services: °Simple extractions, temporary fillings, pulpectomy/pulp debridement, uncomplicated abscess drainage. °Payment Options: °PAYMENT IS DUE AT THE TIME OF SERVICE.  Fee is usually $100-200, additional surgical procedures (e.g. abscess drainage) may be extra. °Cash, checks, Visa/MasterCard accepted.  Can file Medicaid if patient is covered for dental - patient should call case worker to check. °No discount for [facility] patients. °Best way to get seen: °MUST call the day before and get onto the schedule. Can usually be seen the next 1-2 days. No walk-ins accepted. °  °  °[facility] °[phone] °   °Location: °[facility], [address], [place] °Clinic Hours: °M, W, Th, F 8am or 1:30pm, Tues 9a or 1:30 - first come/first served. °Services: °Simple extractions, temporary fillings, uncomplicated abscess drainage.  You do not need to be an [place] resident. °Payment Options: °PAYMENT IS DUE AT THE TIME OF SERVICE. °Dental insurance, otherwise sliding scale - bring proof of income or support. °Depending on income and treatment needed, cost is usually $50-200. °Best way to get seen: °Arrive early as it is first come/first served. °  °  °[facility] °[phone] °  °Location: °[address] °Clinic Hours: °Mon-Thu 8a-5p °Services: °Most basic dental services including extractions and fillings. °Payment Options: °PAYMENT IS DUE AT THE TIME OF SERVICE. °Sliding scale, up to 50% off - bring proof if income or support. °Medicaid with dental option accepted. °Best way to get seen: °Call to schedule an appointment, can usually be seen within 2 weeks OR they will try to see walk-ins - show up at 8a or 2p (you may have to wait). °  °  °[facility] °[phone] °[place] RESIDENTS ONLY °  °Location: °[facility], [address], [place], [place] [address] °Clinic Hours: By appointment only. °Monday - Thursday 8am-5pm, Friday 8am-12pm °Services: Cleanings, fillings, extractions. °Payment Options: °PAYMENT IS DUE AT THE TIME OF SERVICE. °Cash, Visa or MasterCard. Sliding scale - $30 minimum per service. °Best way to get seen: °Come in to office, complete packet and make an appointment - need proof of income °or support monies for each household member and proof of [place] residence. °Usually takes about a month to get in. °  °  °[facility] °[phone] °  °Location: °[address]   [place] °Clinic Hours: Walk-in Urgent Care Dental Services are offered Monday-Friday  mornings only. °The numbers of emergencies accepted daily is limited to the number of °providers available. °Maximum 15 - Mondays, Wednesdays & Thursdays °Maximum 10 - Tuesdays & Fridays °Services: °You do not need to be a [place] resident to be seen for a dental emergency. °Emergencies are defined as pain, swelling, abnormal bleeding, or dental trauma. Walkins will receive x-rays if needed. °NOTE: Dental cleaning is not an emergency. °Payment Options: °PAYMENT IS DUE AT THE TIME OF SERVICE. °Minimum co-pay is $40.00 for uninsured patients. °Minimum co-pay is $3.00 for Medicaid with dental coverage. °Dental Insurance is accepted and must be presented at time of visit. °Medicare does not cover dental. °Forms of payment: Cash, credit card, checks. °Best way to get seen: °If not previously registered with the clinic, walk-in dental registration begins at 7:15 am and is on a first come/first serve basis. °If previously registered with the clinic, call to make an appointment. °  °  °[facility] °[phone] °[place] RESIDENTS ONLY °  °Location: °[address], [place], [place] °Clinic Hours: °Mon-Thu 10a-2p °Services: Extractions only! °Payment Options: °FREE (donations accepted) - bring proof of income or support °Best way to get seen: °Call and schedule an appointment OR come at 8am on the 1st Monday of every month (except for holidays) when it is first come/first served. °  °  °[facility] °[phone] °  °Location: °[address], [place] °Clinic Hours: °Friday mornings °Services, Payment Options, Best way to get seen: °Call for info °

## 2019-12-04 ENCOUNTER — Encounter: Payer: Self-pay | Admitting: Emergency Medicine

## 2019-12-04 ENCOUNTER — Other Ambulatory Visit: Payer: Self-pay

## 2019-12-04 ENCOUNTER — Emergency Department
Admission: EM | Admit: 2019-12-04 | Discharge: 2019-12-04 | Disposition: A | Discharge status: Home / Self Care | Payer: Managed Care, Other (non HMO) | Attending: Emergency Medicine | Admitting: Emergency Medicine

## 2019-12-04 DIAGNOSIS — I10 Essential (primary) hypertension: Secondary | ICD-10-CM | POA: Insufficient documentation

## 2019-12-04 DIAGNOSIS — F17210 Nicotine dependence, cigarettes, uncomplicated: Secondary | ICD-10-CM | POA: Diagnosis not present

## 2019-12-04 DIAGNOSIS — K5792 Diverticulitis of intestine, part unspecified, without perforation or abscess without bleeding: Secondary | ICD-10-CM | POA: Insufficient documentation

## 2019-12-04 DIAGNOSIS — Z79899 Other long term (current) drug therapy: Secondary | ICD-10-CM | POA: Diagnosis not present

## 2019-12-04 DIAGNOSIS — R1032 Left lower quadrant pain: Secondary | ICD-10-CM | POA: Diagnosis present

## 2019-12-04 LAB — URINALYSIS, COMPLETE (UACMP) WITH MICROSCOPIC
Bilirubin Urine: NEGATIVE
Glucose, UA: NEGATIVE mg/dL
Hgb urine dipstick: NEGATIVE
Ketones, ur: NEGATIVE mg/dL
Leukocytes,Ua: NEGATIVE
Nitrite: NEGATIVE
Protein, ur: NEGATIVE mg/dL
Specific Gravity, Urine: 1.021 (ref 1.005–1.030)
pH: 5 (ref 5.0–8.0)

## 2019-12-04 LAB — COMPREHENSIVE METABOLIC PANEL
ALT: 21 U/L (ref 0–44)
AST: 17 U/L (ref 15–41)
Albumin: 4 g/dL (ref 3.5–5.0)
Alkaline Phosphatase: 72 U/L (ref 38–126)
Anion gap: 9 (ref 5–15)
BUN: 11 mg/dL (ref 6–20)
CO2: 27 mmol/L (ref 22–32)
Calcium: 9.2 mg/dL (ref 8.9–10.3)
Chloride: 103 mmol/L (ref 98–111)
Creatinine, Ser: 0.98 mg/dL (ref 0.61–1.24)
GFR calc Af Amer: >60 mL/min (ref >60)
GFR calc non Af Amer: >60 mL/min (ref >60)
Glucose, Bld: 105 mg/dL — ABNORMAL HIGH (ref 70–99)
Potassium: 4 mmol/L (ref 3.5–5.1)
Sodium: 139 mmol/L (ref 135–145)
Total Bilirubin: 0.7 mg/dL (ref 0.3–1.2)
Total Protein: 7.7 g/dL (ref 6.5–8.1)

## 2019-12-04 LAB — LIPASE, BLOOD: Lipase: 32 U/L (ref 11–51)

## 2019-12-04 LAB — CBC
HCT: 44 % (ref 39.0–52.0)
Hemoglobin: 15.2 g/dL (ref 13.0–17.0)
MCH: 32.8 pg (ref 26.0–34.0)
MCHC: 34.5 g/dL (ref 30.0–36.0)
MCV: 95 fL (ref 80.0–100.0)
Platelets: 243 10*3/uL (ref 150–400)
RBC: 4.63 MIL/uL (ref 4.22–5.81)
RDW: 13.5 % (ref 11.5–15.5)
WBC: 12.2 10*3/uL — ABNORMAL HIGH (ref 4.0–10.5)
nRBC: 0 % (ref 0.0–0.2)

## 2019-12-04 MED ORDER — CIPROFLOXACIN HCL 500 MG PO TABS: 500.0000 mg | ORAL_TABLET | Freq: Two times a day (BID) | ORAL | 0 refills | Status: AC

## 2019-12-04 MED ORDER — CIPROFLOXACIN HCL 500 MG PO TABS
500.0000 mg | ORAL_TABLET | Freq: Once | ORAL | Status: AC
  Administered 2019-12-04: 500 mg via ORAL
  Filled 2019-12-04: qty 1

## 2019-12-04 MED ORDER — METRONIDAZOLE 500 MG PO TABS: 500.0000 mg | ORAL_TABLET | Freq: Three times a day (TID) | ORAL | 0 refills | Status: AC

## 2019-12-04 MED ORDER — HYDROCODONE-ACETAMINOPHEN 5-325 MG PO TABS
1.0000 | ORAL_TABLET | Freq: Once | ORAL | Status: AC
  Administered 2019-12-04: 1 via ORAL
  Filled 2019-12-04: qty 1

## 2019-12-04 MED ORDER — ONDANSETRON 4 MG PO TBDP
4.0000 mg | ORAL_TABLET | Freq: Once | ORAL | Status: AC
  Administered 2019-12-04: 4 mg via ORAL
  Filled 2019-12-04: qty 1

## 2019-12-04 NOTE — ED Triage Notes (Signed)
Here for LLQ pain.  Hx diverticulitis and feels same.  No NVD, fever.  Has not taken bp meds today. VSS

## 2019-12-04 NOTE — ED Notes (Signed)
Pt states pain to the LLQ of the abdomen. Pt states history of diverticulitis.

## 2019-12-04 NOTE — ED Notes (Signed)
Pt's bp elevated. Pt states he did not take bp meds this am but has them at home. EDP aware.

## 2019-12-04 NOTE — Discharge Instructions (Signed)
Take Cipro twice daily for the next 10 days. Take Flagyl 3 times daily for the next week.

## 2019-12-04 NOTE — ED Provider Notes (Signed)
Emergency Department Provider Note  ____________________________________________  Time seen: Approximately 9:45 PM  I have reviewed the triage vital signs and the nursing notes.   HISTORY  Chief Complaint Abdominal Pain   Historian Patient     HPI Jim [name] is a 39 y.o. male presents to the emergency department with left lower quadrant abdominal pain.  Patient states that he has a history of diverticulitis and symptoms currently feel similar.  He has had symptoms for the past 2 days.  No fever at home.  Patient states that he has never required admission for diverticulitis and always manages his symptoms with outpatient antibiotics and pain control.   Past Medical History:  Diagnosis Date  . Diverticulitis   . GERD (gastroesophageal reflux disease)   . Hypertension      Immunizations up to date:  Yes.     Past Medical History:  Diagnosis Date  . Diverticulitis   . GERD (gastroesophageal reflux disease)   . Hypertension     There are no problems to display for this patient.   History reviewed. No pertinent surgical history.  Prior to Admission medications   Medication Sig Start Date End Date Taking? Authorizing Provider  cetirizine (ZYRTEC) 5 MG tablet Take 1 tablet (5 mg total) by mouth daily. 03/12/19 04/11/19  Menshew, Charlesetta Ivory, PA-C  ciprofloxacin (CIPRO) 500 MG tablet Take 1 tablet (500 mg total) by mouth 2 (two) times daily for 10 days. 12/04/19 12/14/19  Orvil Feil, PA-C  fluticasone (FLONASE) 50 MCG/ACT nasal spray Place 2 sprays into both nostrils daily. 03/12/19   Menshew, Charlesetta Ivory, PA-C  ibuprofen (ADVIL) 600 MG tablet Take 1 tablet (600 mg total) by mouth every 8 (eight) hours as needed. 07/05/19   Joni Reining, PA-C  metroNIDAZOLE (FLAGYL) 500 MG tablet Take 1 tablet (500 mg total) by mouth 3 (three) times daily for 7 days. 12/04/19 12/11/19  Orvil Feil, PA-C  traMADol (ULTRAM) 50 MG tablet Take 1 tablet (50 mg total) by  mouth every 6 (six) hours as needed. 07/05/19 07/04/20  Joni Reining, PA-C  diphenhydrAMINE (BENADRYL) 25 mg capsule Take 1 capsule (25 mg total) by mouth every 4 (four) hours as needed. 11/23/16 03/12/19  Chinita Pester, FNP    Allergies Patient has no known allergies.  History reviewed. No pertinent family history.  Social History Social History   Tobacco Use  . Smoking status: Current Every Day Smoker  . Smokeless tobacco: Never Used  Vaping Use  . Vaping Use: Some days  Substance Use Topics  . Alcohol use: No  . Drug use: No     Review of Systems  Constitutional: No fever/chills Eyes:  No discharge ENT: No upper respiratory complaints. Respiratory: no cough. No SOB/ use of accessory muscles to breath Gastrointestinal: Patient has left lower quadrant abdominal pain.  Musculoskeletal: Negative for musculoskeletal pain. Skin: Negative for rash, abrasions, lacerations, ecchymosis.    ____________________________________________   PHYSICAL EXAM:  VITAL SIGNS: ED Triage Vitals  Enc Vitals Group     BP 12/04/19 1538 (!) 172/111     Pulse Rate 12/04/19 1538 (!) 104     Resp 12/04/19 1538 18     Temp 12/04/19 1538 98.6 F (37 C)     Temp Source 12/04/19 1538 Oral     SpO2 12/04/19 1538 99 %     Weight 12/04/19 1538 240 lb (108.9 kg)     Height 12/04/19 1538 6\' 2"  (1.88 m)  Head Circumference --      Peak Flow --      Pain Score 12/04/19 1537 8     Pain Loc --      Pain Edu? --      Excl. in GC? --      Constitutional: Alert and oriented. Well appearing and in no acute distress. Eyes: Conjunctivae are normal. PERRL. EOMI. Head: Atraumatic.  Cardiovascular: Normal rate, regular rhythm. Normal S1 and S2.  Good peripheral circulation. Respiratory: Normal respiratory effort without tachypnea or retractions. Lungs CTAB. Good air entry to the bases with no decreased or absent breath sounds Gastrointestinal: Bowel sounds x 4 quadrants.  Patient has left lower  quadrant abdominal tenderness to palpation without guarding. Musculoskeletal: Full range of motion to all extremities. No obvious deformities noted Neurologic:  Normal for age. No gross focal neurologic deficits are appreciated.  Skin:  Skin is warm, dry and intact. No rash noted. Psychiatric: Mood and affect are normal for age. Speech and behavior are normal.   ____________________________________________   LABS (all labs ordered are listed, but only abnormal results are displayed)  Labs Reviewed  COMPREHENSIVE METABOLIC PANEL - Abnormal; Notable for the following components:      Result Value   Glucose, Bld 105 (*)    All other components within normal limits  CBC - Abnormal; Notable for the following components:   WBC 12.2 (*)    All other components within normal limits  URINALYSIS, COMPLETE (UACMP) WITH MICROSCOPIC - Abnormal; Notable for the following components:   Color, Urine YELLOW (*)    APPearance CLEAR (*)    All other components within normal limits  LIPASE, BLOOD   ____________________________________________  EKG   ____________________________________________  RADIOLOGY   No results found.  ____________________________________________    PROCEDURES  Procedure(s) performed:     Procedures     Medications  HYDROcodone-acetaminophen (NORCO/VICODIN) 5-325 MG per tablet 1 tablet (1 tablet Oral Given 12/04/19 2100)  ondansetron (ZOFRAN-ODT) disintegrating tablet 4 mg (4 mg Oral Given 12/04/19 2100)  ciprofloxacin (CIPRO) tablet 500 mg (500 mg Oral Given 12/04/19 2100)     ____________________________________________   INITIAL IMPRESSION / ASSESSMENT AND PLAN / ED COURSE  Pertinent labs & imaging results that were available during my care of the patient were reviewed by me and considered in my medical decision making (see chart for details).      Assessment and plan Diverticulitis 39 year old male presents to the emergency department with  left lower quadrant abdominal pain for the past 24 hours.  Patient was mildly tachycardic at triage and hypertensive.  He reported that he had not had his antihypertensive medications today.  Patient had some mild leukocytosis on CBC.  CMP was reassuring.  Urinalysis did not indicate cystitis.   Patient was discharged with Cipro and Flagyl.  Also discharged with a short course of Norco for pain.  Return precautions were given to return with new or worsening symptoms.    ____________________________________________  FINAL CLINICAL IMPRESSION(S) / ED DIAGNOSES  Final diagnoses:  Diverticulitis      NEW MEDICATIONS STARTED DURING THIS VISIT:  ED Discharge Orders         Ordered    ciprofloxacin (CIPRO) 500 MG tablet  2 times daily     Discontinue  Reprint     12/04/19 2112    metroNIDAZOLE (FLAGYL) 500 MG tablet  3 times daily     Discontinue  Reprint     12/04/19 2112  This chart was dictated using voice recognition software/Dragon. Despite best efforts to proofread, errors can occur which can change the meaning. Any change was purely unintentional.     Gasper Lloyd 12/04/19 2207    Sharman Cheek, MD 12/05/19 [phone]

## 2019-12-23 ENCOUNTER — Other Ambulatory Visit: Payer: Self-pay

## 2019-12-23 ENCOUNTER — Emergency Department
Admission: EM | Admit: 2019-12-23 | Discharge: 2019-12-23 | Disposition: A | Discharge status: Home / Self Care | Payer: Managed Care, Other (non HMO) | Attending: Emergency Medicine | Admitting: Emergency Medicine

## 2019-12-23 ENCOUNTER — Encounter: Payer: Self-pay | Admitting: Emergency Medicine

## 2019-12-23 DIAGNOSIS — Z79899 Other long term (current) drug therapy: Secondary | ICD-10-CM | POA: Diagnosis not present

## 2019-12-23 DIAGNOSIS — F17210 Nicotine dependence, cigarettes, uncomplicated: Secondary | ICD-10-CM | POA: Insufficient documentation

## 2019-12-23 DIAGNOSIS — I10 Essential (primary) hypertension: Secondary | ICD-10-CM | POA: Insufficient documentation

## 2019-12-23 DIAGNOSIS — K047 Periapical abscess without sinus: Secondary | ICD-10-CM | POA: Diagnosis not present

## 2019-12-23 DIAGNOSIS — K0889 Other specified disorders of teeth and supporting structures: Secondary | ICD-10-CM | POA: Diagnosis present

## 2019-12-23 MED ORDER — AMOXICILLIN 875 MG PO TABS
875.0000 mg | ORAL_TABLET | Freq: Two times a day (BID) | ORAL | 0 refills | Status: DC
Start: 2019-12-23 — End: 2020-10-04

## 2019-12-23 MED ORDER — IBUPROFEN 600 MG PO TABS
600.0000 mg | ORAL_TABLET | Freq: Three times a day (TID) | ORAL | 0 refills | Status: DC | PRN
Start: 2019-12-23 — End: 2021-01-20

## 2019-12-23 NOTE — ED Triage Notes (Signed)
Pt presents to ED via POV with c/o R sided dental pain x several days. Pt A&O x4, ambulatory without difficulty at this time.

## 2019-12-23 NOTE — ED Notes (Signed)
See triage note  Presents with possible dental abscess  Pain states several days ago

## 2019-12-23 NOTE — Discharge Instructions (Signed)
Begin taking antibiotics as directed for dental abscess.  Also a list of dental clinics is listed on your discharge papers.  Also on a separate sheet of paper is information about the walk-in dental clinics that include [facility], [facility] and [place].   OPTIONS FOR DENTAL FOLLOW UP CARE  [place] [facility] - Local Safety Net Dental Clinics [email or web address]   [facility] [phone])  [facility] [phone])  [facility] [phone])  [facility] [phone])  [facility] [phone]) This clinic caters to the indigent population and is on a lottery system. Location: [employer] [facility], [employer], [address] [place] Clinic Hours: Wednesdays from 6pm - 9pm, patients seen by a lottery system. For dates, call or go to [email or web address] Services: Cleanings, fillings and simple extractions. Payment Options: DENTAL WORK IS FREE OF CHARGE. Bring proof of income or support. Best way to get seen: Arrive at 5:15 pm - this is a lottery, NOT first come/first serve, so arriving earlier will not increase your chances of being seen.     [facility] [phone] Select option 1 for emergencies   Location: [facility], [place], [address] [place] Clinic Hours: No walk-ins accepted - call the day before to schedule an appointment. Check in times are 9:30 am and 1:30 pm. Services: Simple extractions, temporary fillings, pulpectomy/pulp debridement, uncomplicated abscess drainage. Payment Options: PAYMENT IS DUE AT THE TIME OF SERVICE.  Fee is usually $100-200, additional surgical procedures (e.g. abscess drainage) may be extra. Cash, checks, Visa/MasterCard accepted.  Can file Medicaid if patient is covered for dental - patient should call  case worker to check. No discount for [facility] patients. Best way to get seen: MUST call the day before and get onto the schedule. Can usually be seen the next 1-2 days. No walk-ins accepted.     [facility] [phone]   Location: [facility], [address] [place] Clinic Hours: M, W, Th, F 8am or 1:30pm, Tues 9a or 1:30 - first come/first served. Services: Simple extractions, temporary fillings, uncomplicated abscess drainage.  You do not need to be an [facility] resident. Payment Options: PAYMENT IS DUE AT THE TIME OF SERVICE. Dental insurance, otherwise sliding scale - bring proof of income or support. Depending on income and treatment needed, cost is usually $50-200. Best way to get seen: Arrive early as it is first come/first served.     [facility] [phone]   Location: [address] Clinic Hours: Mon-Thu 8a-5p Services: Most basic dental services including extractions and fillings. Payment Options: PAYMENT IS DUE AT THE TIME OF SERVICE. Sliding scale, up to 50% off - bring proof if income or support. Medicaid with dental option accepted. Best way to get seen: Call to schedule an appointment, can usually be seen within 2 weeks OR they will try to see walk-ins - show up at 8a or 2p (you may have to wait).     [facility] [phone] [place] RESIDENTS ONLY   Location: [facility], [address] [place], [place] [address] Clinic Hours: By appointment only. Monday - Thursday 8am-5pm, Friday 8am-12pm Services: Cleanings, fillings, extractions. Payment Options: PAYMENT IS DUE AT THE TIME OF SERVICE. Cash, Visa or MasterCard. Sliding scale - $30 minimum per service. Best way to get seen: Come in to office, complete packet  and make an appointment - need proof of income or support monies for each household member and proof of  [facility] residence. Usually takes about a month to get in.     [facility] [phone]   Location: [address], [facility] Clinic Hours: Walk-in Urgent Care Dental Services are offered Monday-Friday mornings only. The numbers of emergencies accepted daily is limited to the number of providers available. Maximum 15 - Mondays, Wednesdays & Thursdays Maximum 10 - Tuesdays & Fridays Services: You do not need to be a [facility] resident to be seen for a dental emergency. Emergencies are defined as pain, swelling, abnormal bleeding, or dental trauma. Walkins will receive x-rays if needed. NOTE: Dental cleaning is not an emergency. Payment Options: PAYMENT IS DUE AT THE TIME OF SERVICE. Minimum co-pay is $40.00 for uninsured patients. Minimum co-pay is $3.00 for Medicaid with dental coverage. Dental Insurance is accepted and must be presented at time of visit. Medicare does not cover dental. Forms of payment: Cash, credit card, checks. Best way to get seen: If not previously registered with the clinic, walk-in dental registration begins at 7:15 am and is on a first come/first serve basis. If previously registered with the clinic, call to make an appointment.     [facility] [phone] [place] RESIDENTS ONLY   Location: [address] [place], [place] Clinic Hours: Mon-Thu 10a-2p Services: Extractions only! Payment Options: FREE (donations accepted) - bring proof of income or support Best way to get seen: Call and schedule an appointment OR come at 8am on the 1st Monday of every month (except for holidays) when it is first come/first served.     [facility] [phone]   Location: [address] [place], [place] Clinic Hours: Friday mornings Services, Payment Options, Best way to get seen: Call for info

## 2019-12-23 NOTE — ED Provider Notes (Signed)
[facility] Emergency Department Provider Note  ____________________________________________   First MD Initiated Contact with Patient 12/23/19 1200     (approximate)  I have reviewed the triage vital signs and the nursing notes.   HISTORY  Chief Complaint Dental Pain   HPI Jim [name] is a 39 y.o. male presents to the ED with complaint of right-sided dental pain for last several days but worse this morning when he woke up.  Patient reports that he called a dentist this morning and they were booked 2 months out.  Patient continues to smoke cigarettes daily.  Patient rates his pain as an 8 out of 10.       Past Medical History:  Diagnosis Date  . Diverticulitis   . GERD (gastroesophageal reflux disease)   . Hypertension     There are no problems to display for this patient.   History reviewed. No pertinent surgical history.  Prior to Admission medications   Medication Sig Start Date End Date Taking? Authorizing Provider  amoxicillin (AMOXIL) 875 MG tablet Take 1 tablet (875 mg total) by mouth 2 (two) times daily. 12/23/19   Tommi Rumps, PA-C  cetirizine (ZYRTEC) 5 MG tablet Take 1 tablet (5 mg total) by mouth daily. 03/12/19 04/11/19  Menshew, Charlesetta Ivory, PA-C  ibuprofen (ADVIL) 600 MG tablet Take 1 tablet (600 mg total) by mouth every 8 (eight) hours as needed. 12/23/19   Tommi Rumps, PA-C  diphenhydrAMINE (BENADRYL) 25 mg capsule Take 1 capsule (25 mg total) by mouth every 4 (four) hours as needed. 11/23/16 03/12/19  Triplett, Kasandra Knudsen, FNP  fluticasone (FLONASE) 50 MCG/ACT nasal spray Place 2 sprays into both nostrils daily. 03/12/19 12/23/19  Menshew, Charlesetta Ivory, PA-C    Allergies Patient has no known allergies.  History reviewed. No pertinent family history.  Social History Social History   Tobacco Use  . Smoking status: Current Every Day Smoker  . Smokeless tobacco: Never Used  Vaping Use  . Vaping Use: Some days    Substance Use Topics  . Alcohol use: No  . Drug use: No    Review of Systems Constitutional: No fever/chills Eyes: No visual changes. ENT: Positive for dental pain. Cardiovascular: Denies chest pain. Respiratory: Denies shortness of breath. Gastrointestinal:  No nausea, no vomiting.  Musculoskeletal: Negative for back pain. Skin: Negative for rash. Neurological: Negative for headaches, focal weakness or numbness. ____________________________________________   PHYSICAL EXAM:  VITAL SIGNS: ED Triage Vitals  Enc Vitals Group     BP 12/23/19 1155 (!) 167/110     Pulse Rate 12/23/19 1155 96     Resp 12/23/19 1155 18     Temp 12/23/19 1155 98.2 F (36.8 C)     Temp Source 12/23/19 1155 Oral     SpO2 12/23/19 1155 96 %     Weight 12/23/19 1151 240 lb (108.9 kg)     Height 12/23/19 1151 6\' 2"  (1.88 m)     Head Circumference --      Peak Flow --      Pain Score 12/23/19 1151 8     Pain Loc --      Pain Edu? --      Excl. in GC? --     Constitutional: Alert and oriented. Well appearing and in no acute distress. Eyes: Conjunctivae are normal. PERRL. EOMI. Head: Atraumatic. Mouth/Throat: Mucous membranes are moist.  Oropharynx non-erythematous.  Right lower molar and gum is tender to palpation with a  tongue blade and also in poor repair.  No active drainage is noted.  Moderate tenderness on palpation of the mandible in relationship to the tooth that is seen. Neck: No stridor.   Cardiovascular: Normal rate, regular rhythm. Grossly normal heart sounds.  Good peripheral circulation. Respiratory: Normal respiratory effort.  No retractions. Lungs CTAB. Musculoskeletal: Moves upper and lower extremities no difficulty.  Normal gait was noted. Neurologic:  Normal speech and language. No gross focal neurologic deficits are appreciated.  Skin:  Skin is warm, dry and intact.  Psychiatric: Mood and affect are normal. Speech and behavior are  normal.  ____________________________________________   LABS (all labs ordered are listed, but only abnormal results are displayed)  Labs Reviewed - No data to display  PROCEDURES  Procedure(s) performed (including Critical Care):  Procedures   ____________________________________________   INITIAL IMPRESSION / ASSESSMENT AND PLAN / ED COURSE  As part of my medical decision making, I reviewed the following data within the electronic MEDICAL RECORD NUMBER Notes from prior ED visits and [place] Controlled Substance Database  Jim [name] was evaluated in Emergency Department on 12/23/2019 for the symptoms described in the history of present illness. He was evaluated in the context of the global COVID-19 pandemic, which necessitated consideration that the patient might be at risk for infection with the SARS-CoV-2 virus that causes COVID-19. Institutional protocols and algorithms that pertain to the evaluation of patients at risk for COVID-19 are in a state of rapid change based on information released by regulatory bodies including the CDC and federal and state organizations. These policies and algorithms were followed during the patient's care in the ED.  39 year old male presents to the ED with complaint of right lower jaw pain with poor dentition and repair.  Patient has not been able to see a dentist.  Area is moderately tender to palpation no active drainage was noted.  Patient was given a prescription for Amoxil 875 twice daily for 10 days and ibuprofen 600 mg every 8 hours as needed for pain.  He was given a list of dental clinics including the walk-in clinics in which he does not have to have an appointment.  ____________________________________________   FINAL CLINICAL IMPRESSION(S) / ED DIAGNOSES  Final diagnoses:  Dental abscess     ED Discharge Orders         Ordered    amoxicillin (AMOXIL) 875 MG tablet  2 times daily     Discontinue  Reprint     12/23/19 1222    ibuprofen  (ADVIL) 600 MG tablet  Every 8 hours PRN     Discontinue  Reprint     12/23/19 1222           Note:  This document was prepared using Dragon voice recognition software and may include unintentional dictation errors.    Tommi Rumps, PA-C 12/23/19 1512    Jene Every, MD 01/02/20 [phone]

## 2020-04-28 ENCOUNTER — Emergency Department
Admission: EM | Admit: 2020-04-28 | Discharge: 2020-04-28 | Disposition: A | Discharge status: Home / Self Care | Payer: Managed Care, Other (non HMO) | Attending: Emergency Medicine | Admitting: Emergency Medicine

## 2020-04-28 ENCOUNTER — Encounter: Payer: Self-pay | Admitting: Emergency Medicine

## 2020-04-28 ENCOUNTER — Other Ambulatory Visit: Payer: Self-pay

## 2020-04-28 DIAGNOSIS — W260XXA Contact with knife, initial encounter: Secondary | ICD-10-CM | POA: Diagnosis not present

## 2020-04-28 DIAGNOSIS — S61412A Laceration without foreign body of left hand, initial encounter: Secondary | ICD-10-CM | POA: Diagnosis present

## 2020-04-28 DIAGNOSIS — F17200 Nicotine dependence, unspecified, uncomplicated: Secondary | ICD-10-CM | POA: Insufficient documentation

## 2020-04-28 DIAGNOSIS — Z23 Encounter for immunization: Secondary | ICD-10-CM | POA: Insufficient documentation

## 2020-04-28 DIAGNOSIS — I10 Essential (primary) hypertension: Secondary | ICD-10-CM | POA: Insufficient documentation

## 2020-04-28 MED ORDER — TETANUS-DIPHTH-ACELL PERTUSSIS 5-2.5-18.5 LF-MCG/0.5 IM SUSY
0.5000 mL | PREFILLED_SYRINGE | Freq: Once | INTRAMUSCULAR | Status: AC
  Administered 2020-04-28: 0.5 mL via INTRAMUSCULAR
  Filled 2020-04-28: qty 0.5

## 2020-04-28 NOTE — ED Provider Notes (Signed)
Emergency Department Provider Note  ____________________________________________  Time seen: Approximately 9:17 PM  I have reviewed the triage vital signs and the nursing notes.   HISTORY  Chief Complaint Laceration   Historian Patient     HPI Jim [name] is a 39 y.o. male presents to the emergency department with a 1 cm well approximated laceration along the left palm that was sustained accidentally using a fillet knife.  Patient cannot recall his last tetanus shot.  No numbness or tingling in the left hand.  No other alleviating measures have been attempted.   Past Medical History:  Diagnosis Date   Diverticulitis    GERD (gastroesophageal reflux disease)    Hypertension      Immunizations up to date:  Yes.     Past Medical History:  Diagnosis Date   Diverticulitis    GERD (gastroesophageal reflux disease)    Hypertension     There are no problems to display for this patient.   History reviewed. No pertinent surgical history.  Prior to Admission medications   Medication Sig Start Date End Date Taking? Authorizing Provider  amoxicillin (AMOXIL) 875 MG tablet Take 1 tablet (875 mg total) by mouth 2 (two) times daily. 12/23/19   Tommi Rumps, PA-C  cetirizine (ZYRTEC) 5 MG tablet Take 1 tablet (5 mg total) by mouth daily. 03/12/19 04/11/19  Menshew, Charlesetta Ivory, PA-C  ibuprofen (ADVIL) 600 MG tablet Take 1 tablet (600 mg total) by mouth every 8 (eight) hours as needed. 12/23/19   Tommi Rumps, PA-C  diphenhydrAMINE (BENADRYL) 25 mg capsule Take 1 capsule (25 mg total) by mouth every 4 (four) hours as needed. 11/23/16 03/12/19  Triplett, Kasandra Knudsen, FNP  fluticasone (FLONASE) 50 MCG/ACT nasal spray Place 2 sprays into both nostrils daily. 03/12/19 12/23/19  Menshew, Charlesetta Ivory, PA-C    Allergies Patient has no known allergies.  History reviewed. No pertinent family history.  Social History Social History   Tobacco Use   Smoking status:  Current Every Day Smoker   Smokeless tobacco: Never Used  Building services engineer Use: Some days  Substance Use Topics   Alcohol use: No   Drug use: No     Review of Systems  Constitutional: No fever/chills Eyes:  No discharge ENT: No upper respiratory complaints. Respiratory: no cough. No SOB/ use of accessory muscles to breath Gastrointestinal:   No nausea, no vomiting.  No diarrhea.  No constipation. Musculoskeletal: Negative for musculoskeletal pain. Skin: Patient has hand laceration.     ____________________________________________   PHYSICAL EXAM:  VITAL SIGNS: ED Triage Vitals  Enc Vitals Group     BP 04/28/20 2011 (!) 169/117     Pulse Rate 04/28/20 2011 (!) 118     Resp 04/28/20 2011 20     Temp 04/28/20 2011 98.6 F (37 C)     Temp Source 04/28/20 2011 Oral     SpO2 04/28/20 2011 97 %     Weight 04/28/20 2010 230 lb (104.3 kg)     Height 04/28/20 2010 6' (1.829 m)     Head Circumference --      Peak Flow --      Pain Score 04/28/20 2010 0     Pain Loc --      Pain Edu? --      Excl. in GC? --      Constitutional: Alert and oriented. Well appearing and in no acute distress. Eyes: Conjunctivae are normal. PERRL. EOMI. Head:  Atraumatic. Cardiovascular: Normal rate, regular rhythm. Normal S1 and S2.  Good peripheral circulation. Respiratory: Normal respiratory effort without tachypnea or retractions. Lungs CTAB. Good air entry to the bases with no decreased or absent breath sounds Gastrointestinal: Bowel sounds x 4 quadrants. Soft and nontender to palpation. No guarding or rigidity. No distention. Musculoskeletal: Full range of motion to all extremities. No obvious deformities noted Neurologic:  Normal for age. No gross focal neurologic deficits are appreciated.  Skin: Patient has a 1 cm well approximated laceration along inferior aspect of the left palm.  Laceration is well approximated and deep to underlying dermis. Psychiatric: Mood and affect are  normal for age. Speech and behavior are normal.   ____________________________________________   LABS (all labs ordered are listed, but only abnormal results are displayed)  Labs Reviewed - No data to display ____________________________________________  EKG   ____________________________________________  RADIOLOGY   No results found.  ____________________________________________    PROCEDURES  Procedure(s) performed:     Marland KitchenMarland KitchenLaceration Repair  Date/Time: 04/28/2020 9:19 PM Performed by: Orvil Feil, PA-C Authorized by: Orvil Feil, PA-C   Consent:    Consent obtained:  Verbal   Consent given by:  Patient   Risks discussed:  Infection and pain Anesthesia:    Anesthesia method:  None Laceration details:    Location:  Hand   Hand location:  L palm   Length (cm):  1 Exploration:    Contaminated: no   Treatment:    Amount of cleaning:  Standard   Irrigation solution:  Sterile saline   Irrigation volume:  500   Visualized foreign bodies/material removed: no     Debridement:  None Skin repair:    Repair method:  Tissue adhesive Approximation:    Approximation:  Close Repair type:    Repair type:  Simple Post-procedure details:    Dressing:  Open (no dressing)   Procedure completion:  Tolerated well, no immediate complications       Medications  Tdap (BOOSTRIX) injection 0.5 mL (has no administration in time range)     ____________________________________________   INITIAL IMPRESSION / ASSESSMENT AND PLAN / ED COURSE  Pertinent labs & imaging results that were available during my care of the patient were reviewed by me and considered in my medical decision making (see chart for details).      Assessment and plan Laceration 39 year old male presents to the emergency department with a 1 cm left palm laceration repaired in the emergency department using Dermabond without complication.  Patient's tetanus status was updated in the  emergency department.  All patient questions were answered.    ____________________________________________  FINAL CLINICAL IMPRESSION(S) / ED DIAGNOSES  Final diagnoses:  Laceration of left hand without foreign body, initial encounter      NEW MEDICATIONS STARTED DURING THIS VISIT:  ED Discharge Orders    None          This chart was dictated using voice recognition software/Dragon. Despite best efforts to proofread, errors can occur which can change the meaning. Any change was purely unintentional.     Gasper Lloyd 04/28/20 2120    Sharman Cheek, MD 04/28/20 [phone]

## 2020-04-28 NOTE — ED Triage Notes (Signed)
Pt arrived via POV with reports of laceration to L pinky finger, pt cut it with a filet knife.  Pt states tetanus shot is out of date.
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
## 2020-10-04 ENCOUNTER — Emergency Department
Admission: EM | Admit: 2020-10-04 | Discharge: 2020-10-04 | Disposition: A | Discharge status: Home / Self Care | Payer: Managed Care, Other (non HMO) | Attending: Emergency Medicine | Admitting: Emergency Medicine

## 2020-10-04 ENCOUNTER — Other Ambulatory Visit: Payer: Self-pay

## 2020-10-04 ENCOUNTER — Encounter: Payer: Self-pay | Admitting: Emergency Medicine

## 2020-10-04 DIAGNOSIS — F17200 Nicotine dependence, unspecified, uncomplicated: Secondary | ICD-10-CM | POA: Insufficient documentation

## 2020-10-04 DIAGNOSIS — I10 Essential (primary) hypertension: Secondary | ICD-10-CM | POA: Insufficient documentation

## 2020-10-04 DIAGNOSIS — J029 Acute pharyngitis, unspecified: Secondary | ICD-10-CM | POA: Insufficient documentation

## 2020-10-04 DIAGNOSIS — H6501 Acute serous otitis media, right ear: Secondary | ICD-10-CM | POA: Insufficient documentation

## 2020-10-04 MED ORDER — AMOXICILLIN 875 MG PO TABS
875.0000 mg | ORAL_TABLET | Freq: Two times a day (BID) | ORAL | 0 refills | Status: DC
Start: 2020-10-04 — End: 2020-10-04

## 2020-10-04 MED ORDER — FLUTICASONE PROPIONATE 50 MCG/ACT NA SUSP
1.0000 | Freq: Every day | NASAL | 2 refills | Status: DC
Start: 2020-10-04 — End: 2021-03-05

## 2020-10-04 MED ORDER — PREDNISONE 10 MG PO TABS
30.0000 mg | ORAL_TABLET | Freq: Every day | ORAL | 0 refills | Status: DC
Start: 2020-10-04 — End: 2021-01-20

## 2020-10-04 MED ORDER — AMOXICILLIN 875 MG PO TABS
875.0000 mg | ORAL_TABLET | Freq: Two times a day (BID) | ORAL | 0 refills | Status: DC
Start: 2020-10-04 — End: 2021-01-20

## 2020-10-04 NOTE — ED Notes (Signed)
See triage note  Presents with sore throat ,ear pain and subjective fever

## 2020-10-04 NOTE — ED Provider Notes (Signed)
[facility] Emergency Department Provider Note  ____________________________________________   Event Date/Time   First MD Initiated Contact with Patient 10/04/20 1424     (approximate)  I have reviewed the triage vital signs and the nursing notes.   HISTORY  Chief Complaint Sore Throat    HPI Jim [name] is a 40 y.o. male presents emergency department with sore throat, ear pain, and fever.  Patient states her right ear is clogged up.  Sinus congestion for 1 day.  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  Thinks the ear is congested, pt has all covid vaccines, no known exposure    Past Medical History:  Diagnosis Date  . Diverticulitis   . GERD (gastroesophageal reflux disease)   . Hypertension     There are no problems to display for this patient.   History reviewed. No pertinent surgical history.  Prior to Admission medications   Medication Sig Start Date End Date Taking? Authorizing Provider  amoxicillin (AMOXIL) 875 MG tablet Take 1 tablet (875 mg total) by mouth 2 (two) times daily. 10/04/20  Yes Navie Lamoreaux, Roselyn Bering, PA-C  fluticasone (FLONASE) 50 MCG/ACT nasal spray Place 1 spray into both nostrils daily. 10/04/20 10/04/21 Yes Blessing Ozga, Roselyn Bering, PA-C  predniSONE (DELTASONE) 10 MG tablet Take 3 tablets (30 mg total) by mouth daily with breakfast. 10/04/20  Yes Azizah Lisle, Roselyn Bering, PA-C  ibuprofen (ADVIL) 600 MG tablet Take 1 tablet (600 mg total) by mouth every 8 (eight) hours as needed. 12/23/19   Tommi Rumps, PA-C  diphenhydrAMINE (BENADRYL) 25 mg capsule Take 1 capsule (25 mg total) by mouth every 4 (four) hours as needed. 11/23/16 03/12/19  Chinita Pester, FNP    Allergies Patient has no known allergies.  History reviewed. No pertinent family history.  Social History Social History   Tobacco Use  . Smoking status: Current Every Day Smoker  . Smokeless tobacco: Never Used  Vaping Use  . Vaping Use: Some days   Substance Use Topics  . Alcohol use: No  . Drug use: No    Review of Systems  Constitutional: No fever/chills Eyes: No visual changes. ENT positive sore throat.  Positive ear pain Respiratory: Denies cough Cardiovascular: Denies chest pain Gastrointestinal: Denies abdominal pain Genitourinary: Negative for dysuria. Musculoskeletal: Negative for back pain. Skin: Negative for rash. Psychiatric: no mood changes,     ____________________________________________   PHYSICAL EXAM:  VITAL SIGNS: ED Triage Vitals  Enc Vitals Group     BP 10/04/20 1302 (!) 180/109     Pulse Rate 10/04/20 1302 90     Resp 10/04/20 1302 16     Temp 10/04/20 1302 98.3 F (36.8 C)     Temp Source 10/04/20 1302 Oral     SpO2 10/04/20 1302 96 %     Weight 10/04/20 1238 229 lb 15 oz (104.3 kg)     Height 10/04/20 1238 6' (1.829 m)     Head Circumference --      Peak Flow --      Pain Score 10/04/20 1238 6     Pain Loc --      Pain Edu? --      Excl. in GC? --     Constitutional: Alert and oriented. Well appearing and in no acute distress. Eyes: Conjunctivae are normal.  Head: Atraumatic. Ears: Right TM is bright red and swollen, left TM is normal Nose: Active congestion/rhinnorhea. Mouth/Throat: Mucous membranes are moist.  Throat is red Neck:  supple no lymphadenopathy noted Cardiovascular: Normal rate, regular rhythm. Heart sounds are normal Respiratory: Normal respiratory effort.  No retractions, lungs c t a  GU: deferred Musculoskeletal: FROM all extremities, warm and well perfused Neurologic:  Normal speech and language.  Skin:  Skin is warm, dry and intact. No rash noted. Psychiatric: Mood and affect are normal. Speech and behavior are normal.  ____________________________________________   LABS (all labs ordered are listed, but only abnormal results are displayed)  Labs Reviewed - No data to  display ____________________________________________   ____________________________________________  RADIOLOGY    ____________________________________________   PROCEDURES  Procedure(s) performed: No  Procedures    ____________________________________________   INITIAL IMPRESSION / ASSESSMENT AND PLAN / ED COURSE  Pertinent labs & imaging results that were available during my care of the patient were reviewed by me and considered in my medical decision making (see chart for details).   Patient is 40 year old male presents with ear pain and sore throat.  See HPI.  Physical exam shows patient stable.  Exam is consistent with otitis media and acute pharyngitis.  He was given a prescription for amoxicillin, prednisone 30 mg for 3 days, Flonase.  He is to follow-up with his regular doctor.  He was given a work note as I do not feel it safe for him to climb ladders at this time.  He can return on Monday.  He was discharged stable condition.     Jim [name] was evaluated in Emergency Department on 10/04/2020 for the symptoms described in the history of present illness. He was evaluated in the context of the global COVID-19 pandemic, which necessitated consideration that the patient might be at risk for infection with the SARS-CoV-2 virus that causes COVID-19. Institutional protocols and algorithms that pertain to the evaluation of patients at risk for COVID-19 are in a state of rapid change based on information released by regulatory bodies including the CDC and federal and state organizations. These policies and algorithms were followed during the patient's care in the ED.    As part of my medical decision making, I reviewed the following data within the electronic MEDICAL RECORD NUMBER Nursing notes reviewed and incorporated, Old chart reviewed, Notes from prior ED visits and [place] Controlled Substance Database  ____________________________________________   FINAL CLINICAL IMPRESSION(S) /  ED DIAGNOSES  Final diagnoses:  Right acute serous otitis media, recurrence not specified  Sore throat      NEW MEDICATIONS STARTED DURING THIS VISIT:  New Prescriptions   AMOXICILLIN (AMOXIL) 875 MG TABLET    Take 1 tablet (875 mg total) by mouth 2 (two) times daily.   FLUTICASONE (FLONASE) 50 MCG/ACT NASAL SPRAY    Place 1 spray into both nostrils daily.   PREDNISONE (DELTASONE) 10 MG TABLET    Take 3 tablets (30 mg total) by mouth daily with breakfast.     Note:  This document was prepared using Dragon voice recognition software and may include unintentional dictation errors.    Faythe Ghee, PA-C 10/04/20 1445    Sharman Cheek, MD 10/04/20 [phone]

## 2020-10-04 NOTE — Discharge Instructions (Signed)
Follow-up with your regular doctor if not improving in 3 to 4 days.  Return emergency department worsening.  Use medications as prescribed.  Tylenol and ibuprofen for pain as needed.

## 2020-10-04 NOTE — ED Triage Notes (Signed)
C/O right ear pain/ clogged, sore throat, sinus congestion x 1 day.

## 2021-01-20 ENCOUNTER — Emergency Department
Admission: EM | Admit: 2021-01-20 | Discharge: 2021-01-20 | Disposition: A | Discharge status: Home / Self Care | Payer: Self-pay | Attending: Emergency Medicine | Admitting: Emergency Medicine

## 2021-01-20 ENCOUNTER — Other Ambulatory Visit: Payer: Self-pay

## 2021-01-20 DIAGNOSIS — J029 Acute pharyngitis, unspecified: Secondary | ICD-10-CM | POA: Insufficient documentation

## 2021-01-20 DIAGNOSIS — I10 Essential (primary) hypertension: Secondary | ICD-10-CM | POA: Insufficient documentation

## 2021-01-20 DIAGNOSIS — F17200 Nicotine dependence, unspecified, uncomplicated: Secondary | ICD-10-CM | POA: Insufficient documentation

## 2021-01-20 LAB — GROUP A STREP BY PCR: Group A Strep by PCR: NOT DETECTED

## 2021-01-20 MED ORDER — AZITHROMYCIN 250 MG PO TABS
ORAL_TABLET | ORAL | 0 refills | Status: DC
Start: 2021-01-20 — End: 2021-03-05

## 2021-01-20 NOTE — ED Triage Notes (Signed)
Pt comes pov with sore throat since last night with fever.

## 2021-01-20 NOTE — Discharge Instructions (Addendum)
Take the antibiotic as prescribed.

## 2021-01-20 NOTE — ED Provider Notes (Signed)
Jim [name] ____________________________________________  Time seen: 1458  I have reviewed the triage vital signs and the nursing notes.  HISTORY  Chief Complaint  Sore Throat   HPI Jim [name] is a 40 y.o. male presents to the ED with a 2-day complaint of sore throat.  He reports several episodes of sore throat with coworkers but denies any confirmed strep cases.  He denies any frank fevers but has noted some chills.  Past Medical History:  Diagnosis Date   Diverticulitis    GERD (gastroesophageal reflux disease)    Hypertension     There are no problems to display for this patient.   History reviewed. No pertinent surgical history.  Prior to Admission medications   Medication Sig Start Date End Date Taking? Authorizing Provider  azithromycin (ZITHROMAX Z-PAK) 250 MG tablet Take 2 tablets (500 mg) on  Day 1,  followed by 1 tablet (250 mg) once daily on Days 2 through 5. 01/20/21  Yes Railey Glad, Charlesetta Ivory, PA-C  fluticasone (FLONASE) 50 MCG/ACT nasal spray Place 1 spray into both nostrils daily. 10/04/20 10/04/21  Fisher, Roselyn Bering, PA-C  diphenhydrAMINE (BENADRYL) 25 mg capsule Take 1 capsule (25 mg total) by mouth every 4 (four) hours as needed. 11/23/16 03/12/19  Chinita Pester, FNP    Allergies Patient has no known allergies.  History reviewed. No pertinent family history.  Social History Social History   Tobacco Use   Smoking status: Every Day   Smokeless tobacco: Never  Vaping Use   Vaping Use: Some days  Substance Use Topics   Alcohol use: No   Drug use: No    Review of Systems  Constitutional: Negative for fever. Eyes: Negative for visual changes. ENT: Positive for sore throat. Cardiovascular: Negative for chest pain. Respiratory: Negative for shortness of breath. Gastrointestinal: Negative for abdominal pain, vomiting and diarrhea. Genitourinary: Negative for dysuria. Musculoskeletal:  Negative for back pain. Skin: Negative for rash. Neurological: Negative for headaches, focal weakness or numbness. ____________________________________________  PHYSICAL EXAM:  VITAL SIGNS: ED Triage Vitals  Enc Vitals Group     BP 01/20/21 1317 (!) 157/108     Pulse Rate 01/20/21 1317 (!) 107     Resp 01/20/21 1317 18     Temp 01/20/21 1317 99.5 F (37.5 C)     Temp Source 01/20/21 1317 Oral     SpO2 01/20/21 1317 97 %     Weight 01/20/21 1307 220 lb (99.8 kg)     Height 01/20/21 1307 6\' 1"  (1.854 m)     Head Circumference --      Peak Flow --      Pain Score 01/20/21 1307 7     Pain Loc --      Pain Edu? --      Excl. in GC? --     Constitutional: Alert and oriented. Well appearing and in no distress. Head: Normocephalic and atraumatic. Eyes: Conjunctivae are normal. Normal extraocular movements Mouth/Throat: Mucous membranes are moist. Uvula is midline and tonsils are flat. Oropharynx is erythematous.   Neck: Supple. No thyromegaly. Hematological/Lymphatic/Immunological: No cervical lymphadenopathy. Cardiovascular: Normal rate, regular rhythm. Normal distal pulses. Respiratory: Normal respiratory effort. No wheezes/rales/rhonchi. Musculoskeletal: Nontender with normal range of motion in all extremities.  Neurologic:  Normal gait without ataxia. Normal speech and language. No gross focal neurologic deficits are appreciated. Skin:  Skin is warm, dry and intact. No rash noted. . ____________________________________________    {LABS (pertinent positives/negatives)  Labs Reviewed  GROUP A STREP BY PCR  ____________________________________________  {EKG  ____________________________________________   RADIOLOGY Official radiology report(s): No results found. ____________________________________________  PROCEDURES   Procedures ____________________________________________   INITIAL IMPRESSION / ASSESSMENT AND PLAN / ED COURSE  As part of my medical decision  making, I reviewed the following data within the electronic MEDICAL RECORD NUMBER Labs reviewed WNL and Notes from prior ED visits    DDX: strep throat, viral pharyngitis, viral URI   Patient with ED evaluation of sore throat symptoms patient is evaluated for complaints with a strep screen which is negative at this time.  Clinically patient symptoms may represent a subacute bacterial infection.  As such she will be treated empirically with azithromycin.  He will follow-up with primary provider or return to the ED if needed.  Return precautions of been reviewed.   Jim [name] was evaluated in Emergency Department on 01/21/2021 for the symptoms described in the history of present illness. He was evaluated in the context of the global COVID-19 pandemic, which necessitated consideration that the patient might be at risk for infection with the SARS-CoV-2 virus that causes COVID-19. Institutional protocols and algorithms that pertain to the evaluation of patients at risk for COVID-19 are in a state of rapid change based on information released by regulatory bodies including the CDC and federal and state organizations. These policies and algorithms were followed during the patient's care in the ED. ____________________________________________  FINAL CLINICAL IMPRESSION(S) / ED DIAGNOSES  Final diagnoses:  Sore throat      Manvir Thorson, Charlesetta Ivory, PA-C 01/21/21 1800    Chesley Noon, MD 01/22/21 0004

## 2021-01-20 NOTE — ED Notes (Signed)
See triage note  Presents with sore throat and low grade fever   sxs' started last pm

## 2021-03-05 ENCOUNTER — Other Ambulatory Visit: Payer: Self-pay

## 2021-03-05 ENCOUNTER — Emergency Department
Admission: EM | Admit: 2021-03-05 | Discharge: 2021-03-05 | Disposition: A | Discharge status: Home / Self Care | Payer: Self-pay | Attending: Emergency Medicine | Admitting: Emergency Medicine

## 2021-03-05 DIAGNOSIS — F17200 Nicotine dependence, unspecified, uncomplicated: Secondary | ICD-10-CM | POA: Insufficient documentation

## 2021-03-05 DIAGNOSIS — R040 Epistaxis: Secondary | ICD-10-CM | POA: Insufficient documentation

## 2021-03-05 DIAGNOSIS — Z79899 Other long term (current) drug therapy: Secondary | ICD-10-CM | POA: Insufficient documentation

## 2021-03-05 DIAGNOSIS — I10 Essential (primary) hypertension: Secondary | ICD-10-CM | POA: Insufficient documentation

## 2021-03-05 LAB — BASIC METABOLIC PANEL
Anion gap: 7 (ref 5–15)
BUN: 10 mg/dL (ref 6–20)
CO2: 28 mmol/L (ref 22–32)
Calcium: 9.1 mg/dL (ref 8.9–10.3)
Chloride: 103 mmol/L (ref 98–111)
Creatinine, Ser: 1.01 mg/dL (ref 0.61–1.24)
GFR, Estimated: >60 mL/min (ref >60)
Glucose, Bld: 91 mg/dL (ref 70–99)
Potassium: 3.6 mmol/L (ref 3.5–5.1)
Sodium: 138 mmol/L (ref 135–145)

## 2021-03-05 LAB — CBC
HCT: 46.3 % (ref 39.0–52.0)
Hemoglobin: 16.4 g/dL (ref 13.0–17.0)
MCH: 33.9 pg (ref 26.0–34.0)
MCHC: 35.4 g/dL (ref 30.0–36.0)
MCV: 95.7 fL (ref 80.0–100.0)
Platelets: 250 10*3/uL (ref 150–400)
RBC: 4.84 MIL/uL (ref 4.22–5.81)
RDW: 13.8 % (ref 11.5–15.5)
WBC: 10.4 10*3/uL (ref 4.0–10.5)
nRBC: 0 % (ref 0.0–0.2)

## 2021-03-05 MED ORDER — CLONIDINE HCL 0.1 MG PO TABS
0.2000 mg | ORAL_TABLET | Freq: Once | ORAL | Status: AC
  Administered 2021-03-05: 0.2 mg via ORAL
  Filled 2021-03-05: qty 2

## 2021-03-05 MED ORDER — OXYMETAZOLINE HCL 0.05 % NA SOLN
1.0000 | Freq: Once | NASAL | Status: AC
  Administered 2021-03-05: 1 via NASAL
  Filled 2021-03-05: qty 30

## 2021-03-05 MED ORDER — TRANEXAMIC ACID FOR EPISTAXIS
500.0000 mg | Freq: Once | TOPICAL | Status: AC
  Administered 2021-03-05: 500 mg via TOPICAL

## 2021-03-05 NOTE — Discharge Instructions (Signed)
Read and follow discharge care instruction.  Leave packing in place overnight.  Return back tomorrow morning to have the packing removed and reassess.

## 2021-03-05 NOTE — ED Provider Notes (Signed)
Emergency Medicine Provider Triage Evaluation Note  Jim [name], a 40 y.o. male  was evaluated in triage.  Pt complains of persistent nosebleed.  Patient reports onset of a spontaneous nosebleed about 1030 this morning.  He has had both nostrils with told the paper, and has had ongoing bleeding.  He also notes onset of nosebleeds last week but otherwise denies any trauma or injury.  He denies any blood thinner use but is on blood pressure medicine.  He did not take his blood pressure medicine this morning.  Review of Systems  Positive: epistaxis Negative: Headache, syncope  Physical Exam  BP (!) 198/138   Pulse (!) 101   Temp 97.7 F (36.5 C) (Oral)   Resp 20   Ht 5\' 11"  (1.803 m)   Wt 108.9 kg   SpO2 100%   BMI 33.47 kg/m  Gen:   Awake, no distress  NAD Resp:  Normal effort CTA MSK:   Moves extremities without difficulty  Other:  ENT: Active left-sided epistaxis noted.  Medical Decision Making  Medically screening exam initiated at 5:25 PM.  Appropriate orders placed.  Jim [name] was informed that the remainder of the evaluation will be completed by another provider, this initial triage assessment does not replace that evaluation, and the importance of remaining in the ED until their evaluation is complete.  Patient ED evaluation of intermittent nosebleed since this morning.  Patient presents with toilet tissue and nose.  The toile tissue is removed, and a Afrin soaked gauze is packed into both nostrils with a nose clip applied.  Patient is stable at this time.   [name], PA-C 03/05/21 1727    03/07/21, MD 03/05/21 [phone]

## 2021-03-05 NOTE — ED Triage Notes (Signed)
Pt to ED for nosebleed out of both nostrils since 1030, has both nostrils packed with toilet paper.  States had nosebleed last week but otherwise no hx  Denies blood thinner use  Hx HTN, has not taken meds this am  MSE Jenise PA in triage

## 2021-03-05 NOTE — ED Provider Notes (Signed)
[facility] Emergency Department Provider Note   ____________________________________________   Event Date/Time   First MD Initiated Contact with Patient 03/05/21 1729     (approximate)  I have reviewed the triage vital signs and the nursing notes.   HISTORY  Chief Complaint Epistaxis    HPI DARYLL SPISAK is a 40 y.o. male patient presents with intermittent epistaxis for 1 week.  Patient denies use of blood thinners.  Patient states incident occurred when he got up in the middle of the night and went to the bathroom and sneeze.  Patient said he did not blow his nose which started the bleeding.  Patient state intermitting control with direct pressure.  Denies vertigo or weakness.  Patient takes a blood pressure medication but admits to not taking his morning medication.  Review of patient medical records and visits did not list his blood pressure medication.         Past Medical History:  Diagnosis Date   Diverticulitis    GERD (gastroesophageal reflux disease)    Hypertension     There are no problems to display for this patient.   No past surgical history on file.  Prior to Admission medications   Medication Sig Start Date End Date Taking? Authorizing Provider  amLODipine (NORVASC) 10 MG tablet Take 10 mg by mouth daily.   Yes [provider]  lisinopril-hydrochlorothiazide (ZESTORETIC) 20-25 MG tablet Take 1 tablet by mouth daily.   Yes [provider]  diphenhydrAMINE (BENADRYL) 25 mg capsule Take 1 capsule (25 mg total) by mouth every 4 (four) hours as needed. 11/23/16 03/12/19  Chinita Pester, FNP    Allergies Patient has no known allergies.  No family history on file.  Social History Social History   Tobacco Use   Smoking status: Every Day   Smokeless tobacco: Never  Vaping Use   Vaping Use: Some days  Substance Use Topics   Alcohol use: No   Drug use: No    Review of Systems Constitutional: No  fever/chills Eyes: No visual changes. ENT: No sore throat.  Left nostril bleeding Cardiovascular: Denies chest pain. Respiratory: Denies shortness of breath. Gastrointestinal: No abdominal pain.  No nausea, no vomiting.  No diarrhea.  No constipation. Genitourinary: Negative for dysuria. Musculoskeletal: Negative for back pain. Skin: Negative for rash. Neurological: Negative for headaches, focal weakness or numbness.   ____________________________________________   PHYSICAL EXAM:  VITAL SIGNS: ED Triage Vitals  Enc Vitals Group     BP 03/05/21 1716 (!) 198/138     Pulse Rate 03/05/21 1716 (!) 101     Resp 03/05/21 1716 20     Temp 03/05/21 1716 97.7 F (36.5 C)     Temp Source 03/05/21 1716 Oral     SpO2 03/05/21 1716 100 %     Weight 03/05/21 1717 240 lb (108.9 kg)     Height 03/05/21 1717 5\' 11"  (1.803 m)     Head Circumference --      Peak Flow --      Pain Score 03/05/21 1717 0     Pain Loc --      Pain Edu? --      Excl. in GC? --     Constitutional: Alert and oriented. Well appearing and in no acute distress. Eyes: Conjunctivae are normal. PERRL. EOMI. Head: Atraumatic. Nose: No congestion/rhinnorhea.  Left nostril bleeding. Mouth/Throat: Mucous membranes are moist.  Oropharynx non-erythematous. Neck: No stridor.   Hematological/Lymphatic/Immunilogical: No cervical lymphadenopathy. Cardiovascular:  Normal rate, regular rhythm. Grossly normal heart sounds.  Good peripheral circulation.  Blood pressure is 198/138. Respiratory: Normal respiratory effort.  No retractions. Lungs CTAB. Gastrointestinal: Soft and nontender. No distention. No abdominal bruits. No CVA tenderness. Genitourinary: Deferred Musculoskeletal: No lower extremity tenderness nor edema.  No joint effusions. Neurologic:  Normal speech and language. No gross focal neurologic deficits are appreciated. No gait instability. Skin:  Skin is warm, dry and intact. No rash noted. Psychiatric: Mood and  affect are normal. Speech and behavior are normal.  ____________________________________________   LABS (all labs ordered are listed, but only abnormal results are displayed)  Labs Reviewed  CBC  BASIC METABOLIC PANEL   ____________________________________________  EKG   ____________________________________________  RADIOLOGY I, Joni Reining, personally viewed and evaluated these images (plain radiographs) as part of my medical decision making, as well as reviewing the written report by the radiologist.  ED MD interpretation:    Official radiology report(s): No results found.  ____________________________________________   PROCEDURES  Procedure(s) performed (including Critical Care):  .Epistaxis Management  Date/Time: 03/05/2021 7:22 PM Performed by: Joni Reining, PA-C Authorized by: Joni Reining, PA-C   Consent:    Consent obtained:  Verbal   Consent given by:  Patient   Risks, benefits, and alternatives were discussed: yes     Risks discussed:  Bleeding Universal protocol:    Procedure explained and questions answered to patient or proxy's satisfaction: yes     Relevant documents present and verified: yes     Immediately prior to procedure, a time out was called: yes     Patient identity confirmed:  Verbally with patient Anesthesia:    Anesthesia method:  None Procedure details:    Treatment site:  L anterior   Treatment method:  Merocel sponge   Treatment complexity:  Limited   Treatment episode: initial   Post-procedure details:    Assessment:  Bleeding stopped   Procedure completion:  Tolerated   ____________________________________________   INITIAL IMPRESSION / ASSESSMENT AND PLAN / ED COURSE  As part of my medical decision making, I reviewed the following data within the electronic MEDICAL RECORD NUMBER       Patient presents with left epistaxis.  See procedure  note.  Concerned secondary patient elevated blood pressure and nosebleed  requiring TXA and nasal tampon.  Patient given 0.2 Catapres which reduces blood pressure upon discharge to 143/104.  Patient advised return back in the morning to have packing removed.       ____________________________________________   FINAL CLINICAL IMPRESSION(S) / ED DIAGNOSES  Final diagnoses:  Left-sided epistaxis     ED Discharge Orders     None        Note:  This document was prepared using Dragon voice recognition software and may include unintentional dictation errors.    Joni Reining, PA-C 03/05/21 2004    Minna Antis, MD 03/05/21 2312

## 2021-03-05 NOTE — ED Notes (Signed)
See triage note  presents with nosebleed since about 1030 am

## 2021-03-06 ENCOUNTER — Other Ambulatory Visit: Payer: Self-pay

## 2021-03-06 ENCOUNTER — Emergency Department
Admission: EM | Admit: 2021-03-06 | Discharge: 2021-03-06 | Disposition: A | Discharge status: Home / Self Care | Payer: Self-pay | Attending: Emergency Medicine | Admitting: Emergency Medicine

## 2021-03-06 ENCOUNTER — Encounter: Payer: Self-pay | Admitting: Emergency Medicine

## 2021-03-06 DIAGNOSIS — Z87898 Personal history of other specified conditions: Secondary | ICD-10-CM

## 2021-03-06 DIAGNOSIS — Z79899 Other long term (current) drug therapy: Secondary | ICD-10-CM | POA: Insufficient documentation

## 2021-03-06 DIAGNOSIS — I10 Essential (primary) hypertension: Secondary | ICD-10-CM | POA: Insufficient documentation

## 2021-03-06 DIAGNOSIS — F17200 Nicotine dependence, unspecified, uncomplicated: Secondary | ICD-10-CM | POA: Insufficient documentation

## 2021-03-06 DIAGNOSIS — Z4800 Encounter for change or removal of nonsurgical wound dressing: Secondary | ICD-10-CM | POA: Insufficient documentation

## 2021-03-06 NOTE — ED Triage Notes (Signed)
Pt comes into the ED via POV c/o epistaxis.  Pt was seen here yesterday and had his nose packed.  Pt denies any new bleeding, but he states that he was informed to come back this morning to have the packing removed. Pt did schedule ENT appt for tomorrow at 10:15.

## 2021-03-06 NOTE — ED Provider Notes (Signed)
[facility] Emergency Department Provider Note   ____________________________________________   Event Date/Time   First MD Initiated Contact with Patient 03/06/21 1109     (approximate)  I have reviewed the triage vital signs and the nursing notes.   HISTORY  Chief Complaint Packing removal    HPI Jim [name] is a 40 y.o. male tree of hypertension and GERD who presents to the ED for follow-up of epistaxis.  Patient reports that he was seen in the ED yesterday for bleeding from his left nostril, subsequently had a nasal tampon put in place.  He was told to return to the ED this morning for removal of nasal tampon.  He states that he has not had any recurrent bleeding since then, has been breathing normally with nasal packing in place.  He has been able to schedule follow-up appointment with ENT tomorrow morning.        Past Medical History:  Diagnosis Date   Diverticulitis    GERD (gastroesophageal reflux disease)    Hypertension     There are no problems to display for this patient.   History reviewed. No pertinent surgical history.  Prior to Admission medications   Medication Sig Start Date End Date Taking? Authorizing Provider  amLODipine (NORVASC) 10 MG tablet Take 10 mg by mouth daily.    [provider]  lisinopril-hydrochlorothiazide (ZESTORETIC) 20-25 MG tablet Take 1 tablet by mouth daily.    [provider]  diphenhydrAMINE (BENADRYL) 25 mg capsule Take 1 capsule (25 mg total) by mouth every 4 (four) hours as needed. 11/23/16 03/12/19  Chinita Pester, FNP    Allergies Patient has no known allergies.  History reviewed. No pertinent family history.  Social History Social History   Tobacco Use   Smoking status: Every Day   Smokeless tobacco: Never  Vaping Use   Vaping Use: Some days  Substance Use Topics   Alcohol use: No   Drug use: No    Review of Systems  Constitutional: No fever/chills Eyes: No  visual changes. ENT: No sore throat.  Positive for epistaxis. Cardiovascular: Denies chest pain. Respiratory: Denies shortness of breath. Gastrointestinal: No abdominal pain.  No nausea, no vomiting.  No diarrhea.  No constipation. Genitourinary: Negative for dysuria. Musculoskeletal: Negative for back pain. Skin: Negative for rash. Neurological: Negative for headaches, focal weakness or numbness.  ____________________________________________   PHYSICAL EXAM:  VITAL SIGNS: ED Triage Vitals  Enc Vitals Group     BP 03/06/21 0939 (!) 182/106     Pulse Rate 03/06/21 0939 88     Resp 03/06/21 0939 17     Temp 03/06/21 0939 97.8 F (36.6 C)     Temp Source 03/06/21 0939 Oral     SpO2 03/06/21 0939 94 %     Weight 03/06/21 0935 238 lb 1.6 oz (108 kg)     Height 03/06/21 0935 5\' 11"  (1.803 m)     Head Circumference --      Peak Flow --      Pain Score 03/06/21 0935 0     Pain Loc --      Pain Edu? --      Excl. in GC? --     Constitutional: Alert and oriented. Eyes: Conjunctivae are normal. Head: Atraumatic. Nose: No congestion/rhinnorhea.  Nasal tampon in place to left nare with dried blood, no active bleeding noted. Mouth/Throat: Mucous membranes are moist. Neck: Normal ROM Cardiovascular: Normal rate, regular rhythm. Grossly normal heart sounds.  Respiratory: Normal respiratory effort.  No retractions. Lungs CTAB. Gastrointestinal: Soft and nontender. No distention. Genitourinary: deferred Musculoskeletal: No lower extremity tenderness nor edema. Neurologic:  Normal speech and language. No gross focal neurologic deficits are appreciated. Skin:  Skin is warm, dry and intact. No rash noted. Psychiatric: Mood and affect are normal. Speech and behavior are normal.  ____________________________________________   LABS (all labs ordered are listed, but only abnormal results are displayed)  Labs Reviewed - No data to display   PROCEDURES  Procedure(s) performed  (including Critical Care):  Procedures   ____________________________________________   INITIAL IMPRESSION / ASSESSMENT AND PLAN / ED COURSE      40 year old male with past medical history of hypertension and GERD who presents to the ED for follow-up of epistaxis requiring nasal tampon placement yesterday.  He has had no recurrent bleeding since then, nasal tampon was removed with no active bleeding at this time.  He was counseled to keep the nostril moist with Vaseline and to follow-up as scheduled with ENT tomorrow.  Patient also noted to be hypertensive here in the ED, states he has been compliant with his antihypertensive medications.  He was counseled to follow-up with his PCP regarding blood pressure and to return to the ED for any new or worsening symptoms.  Patient agrees with plan.      ____________________________________________   FINAL CLINICAL IMPRESSION(S) / ED DIAGNOSES  Final diagnoses:  History of epistaxis  Encounter for removal of nasal packing  Primary hypertension     ED Discharge Orders     None        Note:  This document was prepared using Dragon voice recognition software and may include unintentional dictation errors.    Chesley Noon, MD 03/06/21 1146

## 2021-03-06 NOTE — ED Notes (Signed)
See triage note  presents with nasal packing to left nare  was seen yesterday for nose bleed

## 2021-06-10 ENCOUNTER — Other Ambulatory Visit: Payer: Self-pay

## 2021-06-10 ENCOUNTER — Emergency Department
Admission: EM | Admit: 2021-06-10 | Discharge: 2021-06-10 | Disposition: A | Discharge status: Home / Self Care | Payer: Self-pay | Attending: Student in an Organized Health Care Education/Training Program | Admitting: Student in an Organized Health Care Education/Training Program

## 2021-06-10 ENCOUNTER — Encounter: Payer: Self-pay | Admitting: Emergency Medicine

## 2021-06-10 DIAGNOSIS — H1033 Unspecified acute conjunctivitis, bilateral: Secondary | ICD-10-CM

## 2021-06-10 MED ORDER — POLYMYXIN B-TRIMETHOPRIM 10000-0.1 UNIT/ML-% OP SOLN: 2.0000 [drp] | Freq: Four times a day (QID) | OPHTHALMIC | 0 refills | Status: AC

## 2021-06-10 NOTE — ED Provider Notes (Signed)
[facility] Provider Note    Event Date/Time   First MD Initiated Contact with Patient 06/10/21 1154     (approximate)   History   Eye Pain   HPI  Jim [name] is a 41 y.o. male no significant past medical history presenting to the ER for few days of progressively worsening itchy eye particularly on the left side burning in nature and started having matting in the morning is yellow purulent.  Started to affect his right eye.  States he went to work was sent home because his boss told him he was worried he had pinkeye.     Physical Exam   Triage Vital Signs: ED Triage Vitals  Enc Vitals Group     BP 06/10/21 1129 (!) 152/135     Pulse Rate 06/10/21 1129 85     Resp 06/10/21 1129 16     Temp 06/10/21 1129 98.7 F (37.1 C)     Temp Source 06/10/21 1129 Oral     SpO2 06/10/21 1129 99 %     Weight 06/10/21 1133 238 lb 1.6 oz (108 kg)     Height 06/10/21 1133 5\' 11"  (1.803 m)     Head Circumference --      Peak Flow --      Pain Score 06/10/21 1129 8     Pain Loc --      Pain Edu? --      Excl. in GC? --     Most recent vital signs: Vitals:   06/10/21 1129  BP: (!) 152/135  Pulse: 85  Resp: 16  Temp: 98.7 F (37.1 C)  SpO2: 99%     Constitutional: Alert  Eyes: Left greater than right conjunctival injection and redness no hyphema, or hypopyon, PERRLA, EOMI Head: Atraumatic. Nose: No congestion/rhinnorhea. Mouth/Throat: Mucous membranes are moist.   Neck: Painless ROM.  Cardiovascular:   Good peripheral circulation. Respiratory: Normal respiratory effort.  No retractions.  Gastrointestinal: Soft and nontender.  Musculoskeletal:  no deformity Neurologic:  MAE spontaneously. No gross focal neurologic deficits are appreciated.   Skin:  Skin is warm, dry and intact. No rash noted. Psychiatric: Mood and affect are normal. Speech and behavior are normal.    ED Results / Procedures / Treatments   Labs (all labs ordered are listed,  but only abnormal results are displayed) Labs Reviewed - No data to display   EKG     RADIOLOGY    PROCEDURES:  Critical Care performed:   Procedures   MEDICATIONS ORDERED IN ED: Medications - No data to display   IMPRESSION / MDM / ASSESSMENT AND PLAN / ED COURSE  I reviewed the triage vital signs and the nursing notes.                              Differential diagnosis includes, but is not limited to, conjunctivitis, foreign body, abrasion  Patient presenting with symptoms consistent with acute conjunctivitis.  Patient given topical antibiotics.  We discussed return precautions.  Patient stable appropriate for outpatient follow-up.       FINAL CLINICAL IMPRESSION(S) / ED DIAGNOSES   Final diagnoses:  Acute conjunctivitis of both eyes, unspecified acute conjunctivitis type     Rx / DC Orders   ED Discharge Orders          Ordered    trimethoprim-polymyxin b (POLYTRIM) ophthalmic solution  Every 6 hours  06/10/21 1200             Note:  This document was prepared using Dragon voice recognition software and may include unintentional dictation errors.    Willy Eddy, MD 06/10/21 [phone]

## 2021-06-10 NOTE — ED Triage Notes (Signed)
Pt to ED via POV with c/o eye irritation, it started out on Friday, his left eye started out feeling glazed over then on Saturday morning it was matted shut, he then had both eyes matted shut this am. Both eyes are red. Left eye is more red than the right

## 2021-06-10 NOTE — ED Notes (Signed)
Visual Acuity  Right eye 20/40 Left Eye 20/50

## 2021-06-10 NOTE — ED Notes (Signed)
See triage note presents with redness and drainage to both eyes   states left eye is worse than the right

## 2021-08-27 ENCOUNTER — Other Ambulatory Visit: Payer: Self-pay

## 2021-08-27 ENCOUNTER — Emergency Department
Admission: EM | Admit: 2021-08-27 | Discharge: 2021-08-27 | Disposition: A | Discharge status: Home / Self Care | Payer: Self-pay | Attending: Emergency Medicine | Admitting: Emergency Medicine

## 2021-08-27 ENCOUNTER — Emergency Department: Payer: Self-pay

## 2021-08-27 DIAGNOSIS — R1032 Left lower quadrant pain: Secondary | ICD-10-CM

## 2021-08-27 DIAGNOSIS — K922 Gastrointestinal hemorrhage, unspecified: Secondary | ICD-10-CM | POA: Insufficient documentation

## 2021-08-27 DIAGNOSIS — I10 Essential (primary) hypertension: Secondary | ICD-10-CM | POA: Insufficient documentation

## 2021-08-27 LAB — TYPE AND SCREEN
ABO/RH(D): O POS
Antibody Screen: NEGATIVE

## 2021-08-27 LAB — COMPREHENSIVE METABOLIC PANEL
ALT: 35 U/L (ref 0–44)
AST: 23 U/L (ref 15–41)
Albumin: 4 g/dL (ref 3.5–5.0)
Alkaline Phosphatase: 89 U/L (ref 38–126)
Anion gap: 8 (ref 5–15)
BUN: 10 mg/dL (ref 6–20)
CO2: 30 mmol/L (ref 22–32)
Calcium: 9.7 mg/dL (ref 8.9–10.3)
Chloride: 101 mmol/L (ref 98–111)
Creatinine, Ser: 0.9 mg/dL (ref 0.61–1.24)
GFR, Estimated: >60 mL/min (ref >60)
Glucose, Bld: 94 mg/dL (ref 70–99)
Potassium: 3.6 mmol/L (ref 3.5–5.1)
Sodium: 139 mmol/L (ref 135–145)
Total Bilirubin: 0.7 mg/dL (ref 0.3–1.2)
Total Protein: 8.1 g/dL (ref 6.5–8.1)

## 2021-08-27 LAB — CBC
HCT: 50.5 % (ref 39.0–52.0)
Hemoglobin: 16.9 g/dL (ref 13.0–17.0)
MCH: 31.3 pg (ref 26.0–34.0)
MCHC: 33.5 g/dL (ref 30.0–36.0)
MCV: 93.5 fL (ref 80.0–100.0)
Platelets: 260 10*3/uL (ref 150–400)
RBC: 5.4 MIL/uL (ref 4.22–5.81)
RDW: 14 % (ref 11.5–15.5)
WBC: 8.1 10*3/uL (ref 4.0–10.5)
nRBC: 0 % (ref 0.0–0.2)

## 2021-08-27 LAB — URINALYSIS, ROUTINE W REFLEX MICROSCOPIC
Bilirubin Urine: NEGATIVE
Glucose, UA: NEGATIVE mg/dL
Hgb urine dipstick: NEGATIVE
Ketones, ur: NEGATIVE mg/dL
Leukocytes,Ua: NEGATIVE
Nitrite: NEGATIVE
Protein, ur: NEGATIVE mg/dL
Specific Gravity, Urine: 1.012 (ref 1.005–1.030)
pH: 9 — ABNORMAL HIGH (ref 5.0–8.0)

## 2021-08-27 LAB — LIPASE, BLOOD: Lipase: 49 U/L (ref 11–51)

## 2021-08-27 MED ORDER — FAMOTIDINE IN NACL 20-0.9 MG/50ML-% IV SOLN
20.0000 mg | Freq: Once | INTRAVENOUS | Status: AC
  Administered 2021-08-27: 20 mg via INTRAVENOUS
  Filled 2021-08-27: qty 50

## 2021-08-27 MED ORDER — FAMOTIDINE 20 MG PO TABS: 20.0000 mg | ORAL_TABLET | Freq: Two times a day (BID) | ORAL | 1 refills | Status: DC

## 2021-08-27 MED ORDER — IOHEXOL 350 MG/ML SOLN
75.0000 mL | Freq: Once | INTRAVENOUS | Status: AC | PRN
  Administered 2021-08-27: 75 mL via INTRAVENOUS

## 2021-08-27 NOTE — Discharge Instructions (Addendum)
Your exam, labs, and CT scan are normal at this time. Your CT scan did not show any signs of infection, abscess, or diverticulitis. Take the prescription meds as directed. Follow-up with Dr. Marius Ditch for further evaluation. Return to the ED as needed.  ?

## 2021-08-27 NOTE — ED Provider Notes (Signed)
? ? ?[facility] ?Emergency Department Provider Note ? ? ? ? None  ?  (approximate) ? ? ?History  ? ?Abdominal Pain and Blood In Stools ? ? ?HPI ? ?Jim [name] is a 41 y.o. male with a history of HTN, diverticulosis, and GERD presents to the ED for evaluation of lower abdominal cramping and passage of dark blood clots yesterday.  He denies fevers, chills, sweats, chest pain, or shortness of breath.  The patient also denies any constipation or diarrhea.  Patient does endorse some left lower quadrant abdominal discomfort.  ? ? ?Physical Exam  ? ?Triage Vital Signs: ?ED Triage Vitals  ?Enc Vitals Group  ?   BP 08/27/21 1049 (!) 149/101  ?   Pulse Rate 08/27/21 1049 (!) 106  ?   Resp 08/27/21 1049 18  ?   Temp 08/27/21 1049 98.1 ?F (36.7 ?C)  ?   Temp Source 08/27/21 1049 Oral  ?   SpO2 08/27/21 1049 97 %  ?   Weight 08/27/21 1030 240 lb (108.9 kg)  ?   Height --   ?   Head Circumference --   ?   Peak Flow --   ?   Pain Score 08/27/21 1030 8  ?   Pain Loc --   ?   Pain Edu? --   ?   Excl. in [place]? --   ? ? ?Most recent vital signs: ?Vitals:  ? 08/27/21 1200 08/27/21 1433  ?BP: (!) 141/87 138/80  ?Pulse: 85 74  ?Resp:  19  ?Temp:  98.5 ?F (36.9 ?C)  ?SpO2: 95% 98%  ? ? ?General Awake, no distress.  ?CV:  Good peripheral perfusion.  ?RESP:  Normal effort. CTA ?ABD:  No distention. Soft, mildly tender to palp over the LLQ. No rebound, guarding, or rigidity. Normal bowel sounds noted. Heme-negative stool guaiac.  ? ? ?ED Results / Procedures / Treatments  ? ?Labs ?(all labs ordered are listed, but only abnormal results are displayed) ?Labs Reviewed  ?URINALYSIS, ROUTINE W REFLEX MICROSCOPIC - Abnormal; Notable for the following components:  ?    Result Value  ? Color, Urine STRAW (*)   ? APPearance CLEAR (*)   ? pH 9.0 (*)   ? All other components within normal limits  ?LIPASE, BLOOD  ?COMPREHENSIVE METABOLIC PANEL  ?CBC  ?TYPE AND SCREEN  ? ? ? ?EKG ? ? ?RADIOLOGY ? ?I personally viewed and evaluated  these images as part of my medical decision making, as well as reviewing the written report by the radiologist. ? ?ED Provider Interpretation: no acute findings} ? ?CT ABDOMEN PELVIS W CONTRAST ? ?Result Date: 08/27/2021 ?CLINICAL DATA:  Left lower quadrant abdominal pain. EXAM: CT ABDOMEN AND PELVIS WITH CONTRAST TECHNIQUE: Multidetector CT imaging of the abdomen and pelvis was performed using the standard protocol following bolus administration of intravenous contrast. RADIATION DOSE REDUCTION: This exam was performed according to the departmental dose-optimization program which includes automated exposure control, adjustment of the mA and/or kV according to patient size and/or use of iterative reconstruction technique. CONTRAST:  21mL OMNIPAQUE IOHEXOL 350 MG/ML SOLN COMPARISON:  December 13, 2011. FINDINGS: Lower chest: No acute abnormality. Hepatobiliary: No focal liver abnormality is seen. No gallstones, gallbladder wall thickening, or biliary dilatation. Pancreas: Unremarkable. No pancreatic ductal dilatation or surrounding inflammatory changes. Spleen: Normal in size without focal abnormality. Adrenals/Urinary Tract: Adrenal glands and kidneys are unremarkable. No hydronephrosis or renal obstruction is noted. No renal or ureteral calculi are noted. Urinary  bladder is unremarkable. Stomach/Bowel: Stomach is within normal limits. Appendix appears normal. No evidence of bowel wall thickening, distention, or inflammatory changes. Vascular/Lymphatic: No significant vascular findings are present. No enlarged abdominal or pelvic lymph nodes. Reproductive: Prostate is unremarkable. Other: No abdominal wall hernia or abnormality. No abdominopelvic ascites. Musculoskeletal: No acute or significant osseous findings. IMPRESSION: No significant abnormality seen in the abdomen or pelvis. Electronically Signed   By: Marijo Conception M.D.   On: 08/27/2021 12:32   ? ? ?PROCEDURES: ? ?Critical Care performed:  No ? ?Procedures ? ? ?MEDICATIONS ORDERED IN ED: ?Medications  ?iohexol (OMNIPAQUE) 350 MG/ML injection 75 mL (75 mLs Intravenous Contrast Given 08/27/21 1216)  ?famotidine (PEPCID) IVPB 20 mg premix (0 mg Intravenous Stopped 08/27/21 1433)  ? ? ? ?IMPRESSION / MDM / ASSESSMENT AND PLAN / ED COURSE  ?I reviewed the triage vital signs and the nursing notes. ?             ?               ? ?Differential diagnosis includes, but is not limited to, acute appendicitis, renal colic, testicular torsion, urinary tract infection/pyelonephritis, prostatitis,  epididymitis, diverticulitis, small bowel obstruction or ileus, colitis, abdominal aortic aneurysm, gastroenteritis, hernia, etc. ? ?The patient is on the cardiac monitor to evaluate for evidence of arrhythmia and/or significant heart rate changes. ? ?Patient to the ED for evaluation of dark bloody stools. Evaluation by labs and CT are reassuring. There is no evidence of acute leukocytosis, critical anemia, or electrolyte abnormality. CT is normal and reassuring without evidence of diverticulitis or colitis. Patient's diagnosis is consistent with upper GI bleed. Patient will be discharged home with prescriptions for famotidine. Patient is to follow up with GI as needed or otherwise directed. Patient is given ED precautions to return to the ED for any worsening or new symptoms. ? ? ? ?FINAL CLINICAL IMPRESSION(S) / ED DIAGNOSES  ? ?Final diagnoses:  ?Left lower quadrant abdominal pain  ?Upper GI bleed  ? ? ? ?Rx / DC Orders  ? ?ED Discharge Orders   ? ?      Ordered  ?  famotidine (PEPCID) 20 MG tablet  2 times daily       ? 08/27/21 1407  ? ?  ?  ? ?  ? ? ? ?Note:  This document was prepared using Dragon voice recognition software and may include unintentional dictation errors. ? ?  ?Dallas Scorsone, Dannielle Karvonen, PA-C ?08/27/21 2003 ? ?  ?Delman Kitten, MD ?08/29/21 1541 ? ?

## 2021-08-27 NOTE — ED Triage Notes (Signed)
Pt comes pov with lower abd cramping and dark stools since yesterday. Not on thinners.  ?

## 2022-02-16 ENCOUNTER — Encounter: Payer: Self-pay | Admitting: Emergency Medicine

## 2022-02-16 ENCOUNTER — Other Ambulatory Visit: Payer: Self-pay

## 2022-02-16 ENCOUNTER — Emergency Department
Admission: EM | Admit: 2022-02-16 | Discharge: 2022-02-16 | Disposition: A | Discharge status: Home / Self Care | Payer: Self-pay | Attending: Emergency Medicine | Admitting: Emergency Medicine

## 2022-02-16 DIAGNOSIS — J0100 Acute maxillary sinusitis, unspecified: Secondary | ICD-10-CM | POA: Insufficient documentation

## 2022-02-16 DIAGNOSIS — H66004 Acute suppurative otitis media without spontaneous rupture of ear drum, recurrent, right ear: Secondary | ICD-10-CM | POA: Insufficient documentation

## 2022-02-16 DIAGNOSIS — H66001 Acute suppurative otitis media without spontaneous rupture of ear drum, right ear: Secondary | ICD-10-CM

## 2022-02-16 MED ORDER — AMOXICILLIN-POT CLAVULANATE 875-125 MG PO TABS: 1.0000 | ORAL_TABLET | Freq: Two times a day (BID) | ORAL | 0 refills | Status: AC

## 2022-02-16 MED ORDER — FLUTICASONE PROPIONATE 50 MCG/ACT NA SUSP: 2.0000 | Freq: Every day | NASAL | 0 refills | Status: DC

## 2022-02-16 MED ORDER — DEXAMETHASONE 4 MG PO TABS
10.0000 mg | ORAL_TABLET | Freq: Once | ORAL | Status: AC
  Administered 2022-02-16: 10 mg via ORAL
  Filled 2022-02-16: qty 3

## 2022-02-16 NOTE — ED Provider Notes (Signed)
[facility] Provider Note    Event Date/Time   First MD Initiated Contact with Patient 02/16/22 1444     (approximate)   History   Otalgia   HPI  Jim [name] is a 41 y.o. male with history of recurrent sinus infections, nasal congestion, here with sore throat and right ear pain.  Patient states that for the last 2 to 3 days, he has had nasal congestion, sinus pressure, and sore throat.  He has a history of similar symptoms every year.  He states that over the last 2 days or so, he has had progressively worsening decreased hearing in the right ear.  He has had some associated aural fullness.  Has history of ear infection with similar symptoms.  Denies any trauma to the ear.  No drainage.  No bleeding.  No other acute complaints.     Physical Exam   Triage Vital Signs: ED Triage Vitals  Enc Vitals Group     BP 02/16/22 1410 (!) 177/103     Pulse Rate 02/16/22 1410 (!) 110     Resp 02/16/22 1410 20     Temp 02/16/22 1410 98.2 F (36.8 C)     Temp Source 02/16/22 1410 Oral     SpO2 02/16/22 1410 96 %     Weight 02/16/22 1356 240 lb 4.8 oz (109 kg)     Height 02/16/22 1356 5\' 11"  (1.803 m)     Head Circumference --      Peak Flow --      Pain Score 02/16/22 1356 0     Pain Loc --      Pain Edu? --      Excl. in [place]? --     Most recent vital signs: Vitals:   02/16/22 1410 02/16/22 1518  BP: (!) 177/103 (!) 146/79  Pulse: (!) 110 91  Resp: 20 18  Temp: 98.2 F (36.8 C)   SpO2: 96% 92%     General: Awake, no distress.  CV:  Good peripheral perfusion.  Resp:  Normal effort.  Abd:  No distention.  Other:  Moderate nasal congestion and edema bilaterally.  Mild sinus tenderness with no facial swelling or erythema.  Right tympanic membrane erythematous, opaque, and bulging.  No mastoid tenderness.  External auditory canals normal.  Left tympanic membrane normal.  No neck pain or stiffness.   ED Results / Procedures / Treatments   Labs (all  labs ordered are listed, but only abnormal results are displayed) Labs Reviewed - No data to display   EKG    RADIOLOGY    I also independently reviewed and agree with radiologist interpretations.   PROCEDURES:  Critical Care performed: No   MEDICATIONS ORDERED IN ED: Medications  dexamethasone (DECADRON) tablet 10 mg (10 mg Oral Given 02/16/22 1516)     IMPRESSION / MDM / ASSESSMENT AND PLAN / ED COURSE  I reviewed the triage vital signs and the nursing notes.                              Ddx:  Differential includes the following, with pertinent life- or limb-threatening emergencies considered:  Acute otitis media, sinusitis, cerumen impaction, otitis externa, unlikely referred pain from neck process, no signs of carotid or vertebral dissection  Patient's presentation is most consistent with acute illness / injury with system symptoms.  MDM:  41 year old male with history of recurrent sinus infections here with  right ear pain and facial pain/pressure.  Clinically, patient has acute otitis media of the right ear.  No evidence of mastoiditis or other complication.  No evidence of otitis externa.  The tympanic membrane is intact.  Patient also has likely concomitant bacterial sinusitis.  Will treat symptomatically for this as well as give antibiotics.  Will start Flonase.     MEDICATIONS GIVEN IN ED: Medications  dexamethasone (DECADRON) tablet 10 mg (10 mg Oral Given 02/16/22 1516)     Consults:     EMR reviewed       FINAL CLINICAL IMPRESSION(S) / ED DIAGNOSES   Final diagnoses:  Non-recurrent acute suppurative otitis media of right ear without spontaneous rupture of tympanic membrane  Acute non-recurrent maxillary sinusitis     Rx / DC Orders   ED Discharge Orders          Ordered    amoxicillin-clavulanate (AUGMENTIN) 875-125 MG tablet  2 times daily        02/16/22 1527    fluticasone (FLONASE) 50 MCG/ACT nasal spray  Daily        02/16/22  1527             Note:  This document was prepared using Dragon voice recognition software and may include unintentional dictation errors.   Shaune Pollack, MD 02/16/22 [phone]

## 2022-02-16 NOTE — ED Notes (Signed)
Pt A&OX4 ambulatory at d/c with independent steady gait. Pt verbalized understanding of d/c instructions, prescriptions and follow up care. 

## 2022-02-16 NOTE — ED Triage Notes (Signed)
Pt reports has some sinus drainage that happens at night and is now having issues with his right ear and hearing out of it. Pt states it sounds like he is under water.

## 2022-02-16 NOTE — Discharge Instructions (Signed)
Take the augmentin antibiotic as prescribed  I've also prescribed FLONASE, a steroid nasal spray. You can also purchase this over-the-counter. Use 2 sprays daily for the next 1-2 months to help treat sinus inflammation and help drainage of your ear.

## 2022-03-02 ENCOUNTER — Emergency Department
Admission: EM | Admit: 2022-03-02 | Discharge: 2022-03-02 | Disposition: A | Discharge status: Home / Self Care | Payer: Self-pay | Attending: Emergency Medicine | Admitting: Emergency Medicine

## 2022-03-02 ENCOUNTER — Other Ambulatory Visit: Payer: Self-pay

## 2022-03-02 ENCOUNTER — Encounter: Payer: Self-pay | Admitting: Emergency Medicine

## 2022-03-02 DIAGNOSIS — H6991 Unspecified Eustachian tube disorder, right ear: Secondary | ICD-10-CM | POA: Insufficient documentation

## 2022-03-02 MED ORDER — PREDNISONE 10 MG PO TABS: ORAL_TABLET | ORAL | 0 refills | Status: DC

## 2022-03-02 NOTE — ED Provider Notes (Signed)
[facility] Provider Note    Event Date/Time   First MD Initiated Contact with Patient 03/02/22 1407     (approximate)   History   Ear Fullness   HPI  Jim [name] is a 41 y.o. male   presents to the ED with complaint of right ear pain and decreased hearing.  Patient was seen in the emergency department on 02/16/2022 and was prescribed Augmentin 875 twice daily for 10 days.  Patient states that his hearing has not improved since taking this medication and he continues to take the Flonase nasal spray.  Patient has a history of hypertension and admits that he has not taken his medication for the last 2 days.      Physical Exam   Triage Vital Signs: ED Triage Vitals  Enc Vitals Group     BP 03/02/22 1318 (!) 176/118     Pulse Rate 03/02/22 1318 83     Resp 03/02/22 1318 20     Temp 03/02/22 1318 98.2 F (36.8 C)     Temp Source 03/02/22 1318 Oral     SpO2 03/02/22 1318 97 %     Weight 03/02/22 1248 240 lb 4.8 oz (109 kg)     Height 03/02/22 1248 5\' 11"  (1.803 m)     Head Circumference --      Peak Flow --      Pain Score 03/02/22 1248 0     Pain Loc --      Pain Edu? --      Excl. in [place]? --     Most recent vital signs: Vitals:   03/02/22 1318  BP: (!) 176/118  Pulse: 83  Resp: 20  Temp: 98.2 F (36.8 C)  SpO2: 97%     General: Awake, no distress.  CV:  Good peripheral perfusion.  Resp:  Normal effort.  Abd:  No distention.  Other:  Left EAC and TM are clear.  Right EAC is clear however TM is dull with fluid present.  Poor light reflex.  No erythema or injection is noted.   ED Results / Procedures / Treatments   Labs (all labs ordered are listed, but only abnormal results are displayed) Labs Reviewed - No data to display    PROCEDURES:  Critical Care performed:   Procedures   MEDICATIONS ORDERED IN ED: Medications - No data to display   IMPRESSION / MDM / ASSESSMENT AND PLAN / ED COURSE  I reviewed the triage vital  signs and the nursing notes.   Differential diagnosis includes, but is not limited to, otitis media recurrent, otitis externa, eustachian tube dysfunction, serous otitis.  41 year old male presents to the ED with complaint of right ear pain and difficulty hearing.  He was recently seen in the emergency department for an otitis media and treated with an injection of Decadron 10 mg IM and a 10-day prescription for Augmentin 875 twice daily.  Patient states that this helped "a little".  He is most concerned about hearing loss to the right ear.  I explained to patient that the infection has cleared however he does have eustachian tube dysfunction and there is fluid behind the eardrum causing the pressure and decreasing his hearing.  A prescription for prednisone tapering dose over the next 6 days and patient already has Flonase which he will continue use daily.  He also strongly encouraged to take his blood pressure medication as his blood pressure was elevated in the emergency department.  Patient  was given the name and addresses of the doctor on-call for [facility] to follow-up with.      Patient's presentation is most consistent with acute, uncomplicated illness.  FINAL CLINICAL IMPRESSION(S) / ED DIAGNOSES   Final diagnoses:  Dysfunction of right eustachian tube     Rx / DC Orders   ED Discharge Orders          Ordered    predniSONE (DELTASONE) 10 MG tablet        03/02/22 1437             Note:  This document was prepared using Dragon voice recognition software and may include unintentional dictation errors.   Johnn Hai, PA-C 03/02/22 1446    Harvest Dark, MD 03/03/22 1421

## 2022-03-02 NOTE — Discharge Instructions (Addendum)
Begin taking medication that was sent to the pharmacy today over the next 6 days.  Also continue using your Flonase nasal spray 2 sprays in each nostril once a day.  If this is not improving with fluid that is on your ear currently you may need to see a ear specialist.  Dr. Richardson Landry is on-call for [facility] and his phone number and address are listed on your discharge papers.  Occasionally is necessary to have tubes put in your ears to correct this.

## 2022-03-02 NOTE — ED Triage Notes (Signed)
Pt reports was seen here for an ear infection, took his meds and it got better but he still has a hard time hearing out of it so wants it checked again.

## 2022-03-02 NOTE — ED Provider Triage Note (Signed)
  Emergency Medicine Provider Triage Evaluation Note  Jim [name] , a 41 y.o.male,  was evaluated in triage.  Pt complains of ear pain.  Patient states that he was seen here recently for an ear infection and prescribed Augmentin.  He states that he got better, but is still having persistent pain and difficulty hearing out of it and would like it reevaluated.  Denies any other symptoms.   Review of Systems  Positive: Ear pain/fullness. Negative: Denies fever, chest pain, vomiting  Physical Exam   Vitals:   03/02/22 1318  BP: (!) 176/118  Pulse: 83  Resp: 20  Temp: 98.2 F (36.8 C)  SpO2: 97%   Gen:   Awake, no distress   Resp:  Normal effort  MSK:   Moves extremities without difficulty  Other:    Medical Decision Making  Given the patient's initial medical screening exam, the following diagnostic evaluation has been ordered. The patient will be placed in the appropriate treatment space, once one is available, to complete the evaluation and treatment. I have discussed the plan of care with the patient and I have advised the patient that an ED physician or mid-level practitioner will reevaluate their condition after the test results have been received, as the results may give them additional insight into the type of treatment they may need.    Diagnostics: None immediately.  Treatments: none immediately   Teodoro Spray, [place] 03/02/22 1323

## 2022-08-15 ENCOUNTER — Emergency Department
Admission: EM | Admit: 2022-08-15 | Discharge: 2022-08-15 | Disposition: A | Discharge status: Home / Self Care | Payer: Self-pay | Attending: Emergency Medicine | Admitting: Emergency Medicine

## 2022-08-15 ENCOUNTER — Other Ambulatory Visit: Payer: Self-pay

## 2022-08-15 DIAGNOSIS — K047 Periapical abscess without sinus: Secondary | ICD-10-CM | POA: Insufficient documentation

## 2022-08-15 DIAGNOSIS — Z5321 Procedure and treatment not carried out due to patient leaving prior to being seen by health care provider: Secondary | ICD-10-CM | POA: Insufficient documentation

## 2022-08-15 NOTE — ED Provider Notes (Signed)
Patient presented to the emergency department, I entered the room after patient had been triaged.  When I entered the room, there was no patient to be found.  It appears that patient may have left as one of our nurses stated there was a individual walking down the hall stating that he was not can wait any longer.  Unsure whether this was the patient or not.  Waited for another 45 minutes and entered the room several times with no return of the patient.  Patient apparently left at this time.  I have not seen nor treated the patient.   Brynda Peon 08/15/22 2317    Rada Hay, MD 08/16/22 2329

## 2022-08-15 NOTE — ED Triage Notes (Signed)
Pt to ED via POV c/o tooth abscess. Pt reports pain in right side of mouth, thinbks he has abscess on top. Denies CP, SOB, dizziness,

## 2022-09-03 ENCOUNTER — Emergency Department: Payer: Self-pay

## 2022-09-03 ENCOUNTER — Observation Stay
Admission: EM | Admit: 2022-09-03 | Discharge: 2022-09-04 | Discharge status: Against Medical Advice | Payer: Self-pay | Attending: Family Medicine | Admitting: Family Medicine

## 2022-09-03 ENCOUNTER — Encounter: Payer: Self-pay | Admitting: Emergency Medicine

## 2022-09-03 DIAGNOSIS — R079 Chest pain, unspecified: Secondary | ICD-10-CM | POA: Diagnosis present

## 2022-09-03 DIAGNOSIS — R55 Syncope and collapse: Secondary | ICD-10-CM | POA: Insufficient documentation

## 2022-09-03 DIAGNOSIS — R0789 Other chest pain: Principal | ICD-10-CM | POA: Insufficient documentation

## 2022-09-03 DIAGNOSIS — Z79899 Other long term (current) drug therapy: Secondary | ICD-10-CM | POA: Insufficient documentation

## 2022-09-03 DIAGNOSIS — R531 Weakness: Secondary | ICD-10-CM | POA: Insufficient documentation

## 2022-09-03 DIAGNOSIS — I10 Essential (primary) hypertension: Secondary | ICD-10-CM | POA: Insufficient documentation

## 2022-09-03 LAB — BASIC METABOLIC PANEL
Anion gap: 10 (ref 5–15)
BUN: 13 mg/dL (ref 6–20)
CO2: 25 mmol/L (ref 22–32)
Calcium: 9.3 mg/dL (ref 8.9–10.3)
Chloride: 102 mmol/L (ref 98–111)
Creatinine, Ser: 0.93 mg/dL (ref 0.61–1.24)
GFR, Estimated: >60 mL/min (ref >60)
Glucose, Bld: 100 mg/dL — ABNORMAL HIGH (ref 70–99)
Potassium: 3.4 mmol/L — ABNORMAL LOW (ref 3.5–5.1)
Sodium: 137 mmol/L (ref 135–145)

## 2022-09-03 LAB — CBC
HCT: 49.9 % (ref 39.0–52.0)
Hemoglobin: 17.2 g/dL — ABNORMAL HIGH (ref 13.0–17.0)
MCH: 32.1 pg (ref 26.0–34.0)
MCHC: 34.5 g/dL (ref 30.0–36.0)
MCV: 93.3 fL (ref 80.0–100.0)
Platelets: 268 10*3/uL (ref 150–400)
RBC: 5.35 MIL/uL (ref 4.22–5.81)
RDW: 14 % (ref 11.5–15.5)
WBC: 14.2 10*3/uL — ABNORMAL HIGH (ref 4.0–10.5)
nRBC: 0 % (ref 0.0–0.2)

## 2022-09-03 LAB — TROPONIN I (HIGH SENSITIVITY)
Troponin I (High Sensitivity): 5 ng/L (ref <18)
Troponin I (High Sensitivity): 6 ng/L (ref <18)

## 2022-09-03 MED ORDER — NITROGLYCERIN 2 % TD OINT
0.5000 [in_us] | TOPICAL_OINTMENT | Freq: Once | TRANSDERMAL | Status: AC
  Administered 2022-09-03: 0.5 [in_us] via TOPICAL
  Filled 2022-09-03: qty 1

## 2022-09-03 NOTE — ED Provider Notes (Signed)
[facility] Provider Note    Event Date/Time   First MD Initiated Contact with Patient 09/03/22 2305     (approximate)   History   Chest Pain   HPI  Jim [name] is a 42 y.o. male who presents to the ED from home with a chief complaint of chest pain.  Patient with a history of hypertension, GERD who had onset of left-sided chest heaviness after playing with his kids outside tonight.  This was around 6 PM.  EMS was called to the house and given 3 baby aspirin in addition to the 1 baby aspirin patient had already taken.  Patient currently denies chest pain.  Denies associated shortness of breath, palpitations, vomiting or dizziness.  Endorses nausea and diaphoresis but he was outdoors playing at the time in the heat.  Denies recent travel, trauma or hormone use.     Past Medical History   Past Medical History:  Diagnosis Date   Diverticulitis    GERD (gastroesophageal reflux disease)    Hypertension      Active Problem List   Patient Active Problem List   Diagnosis Date Noted   Chest pain 09/04/2022     Past Surgical History  History reviewed. No pertinent surgical history.   Home Medications   Prior to Admission medications   Medication Sig Start Date End Date Taking? Authorizing Provider  amLODipine (NORVASC) 10 MG tablet Take 10 mg by mouth daily.    [provider]  famotidine (PEPCID) 20 MG tablet Take 1 tablet (20 mg total) by mouth 2 (two) times daily. 08/27/21 08/27/22  Menshew, Charlesetta Ivory, PA-C  fluticasone (FLONASE) 50 MCG/ACT nasal spray Place 2 sprays into both nostrils daily. 02/16/22 03/18/22  Shaune Pollack, MD  lisinopril-hydrochlorothiazide (ZESTORETIC) 20-25 MG tablet Take 1 tablet by mouth daily.    [provider]  predniSONE (DELTASONE) 10 MG tablet Take 6 tablets  today, on day 2 take 5 tablets, day 3 take 4 tablets, day 4 take 3 tablets, day 5 take  2 tablets and 1 tablet the last day 03/02/22    Tommi Rumps, PA-C  diphenhydrAMINE (BENADRYL) 25 mg capsule Take 1 capsule (25 mg total) by mouth every 4 (four) hours as needed. 11/23/16 03/12/19  Chinita Pester, FNP     Allergies  Patient has no known allergies.   Family History  History reviewed. No pertinent family history.   Physical Exam  Triage Vital Signs: ED Triage Vitals  Enc Vitals Group     BP 09/03/22 2056 (!) 141/109     Pulse Rate 09/03/22 2056 100     Resp 09/03/22 2056 18     Temp 09/03/22 2056 98.5 F (36.9 C)     Temp src --      SpO2 09/03/22 2056 98 %     Weight 09/03/22 2055 232 lb 2.3 oz (105.3 kg)     Height 09/03/22 2055  (1.803 m)     Head Circumference --      Peak Flow --      Pain Score 09/03/22 2055 6     Pain Loc --      Pain Edu? --      Excl. in [place]? --     Updated Vital Signs: BP (!) 140/92   Pulse 80   Temp 98.5 F (36.9 C)   Resp 19   Ht  (1.803 m)   Wt 105.3 kg   SpO2 94%  BMI 32.38 kg/m    General: Awake, no distress.  CV:  RRR.  Good peripheral perfusion.  Resp:  Normal effort.  CTAB. Abd:  Nontender.  No distention.  Other:  Left anterior chest mildly tender to palpation.   ED Results / Procedures / Treatments  Labs (all labs ordered are listed, but only abnormal results are displayed) Labs Reviewed  BASIC METABOLIC PANEL - Abnormal; Notable for the following components:      Result Value   Potassium 3.4 (*)    Glucose, Bld 100 (*)    All other components within normal limits  CBC - Abnormal; Notable for the following components:   WBC 14.2 (*)    Hemoglobin 17.2 (*)    All other components within normal limits  HEPATIC FUNCTION PANEL  LIPASE, BLOOD  URINE DRUG SCREEN, QUALITATIVE ([facility] ONLY)  TROPONIN I (HIGH SENSITIVITY)  TROPONIN I (HIGH SENSITIVITY)     EKG  ED ECG REPORT I, Lucus Lambertson J, the attending physician, personally viewed and interpreted this ECG.   Date: 09/03/2022  EKG Time: 2053  Rate: 108  Rhythm: sinus  tachycardia  Axis: LAD  Intervals:none  ST&T Change: Nonspecific    RADIOLOGY I have independently visualized and interpreted patient's x-ray as well as noted the radiology interpretation:  X-ray: No acute cardiopulmonary process  CT head: No ICH  Official radiology report(s): CT Head Wo Contrast  Result Date: 09/03/2022 CLINICAL DATA:  Altered mental status EXAM: CT HEAD WITHOUT CONTRAST TECHNIQUE: Contiguous axial images were obtained from the base of the skull through the vertex without intravenous contrast. RADIATION DOSE REDUCTION: This exam was performed according to the departmental dose-optimization program which includes automated exposure control, adjustment of the mA and/or kV according to patient size and/or use of iterative reconstruction technique. COMPARISON:  01/01/2018 FINDINGS: Brain: No evidence of acute infarction, hemorrhage, hydrocephalus, extra-axial collection or mass lesion/mass effect. Vascular: No hyperdense vessel or unexpected calcification. Skull: Normal. Negative for fracture or focal lesion. Sinuses/Orbits: The visualized paranasal sinuses are essentially clear. The mastoid air cells are unopacified. Other: None. IMPRESSION: Normal head CT. Electronically Signed   By: Charline Bills M.D.   On: 09/03/2022 23:50   DG Chest 2 View  Result Date: 09/03/2022 CLINICAL DATA:  Chest pain EXAM: CHEST - 2 VIEW COMPARISON:  06/24/2007 FINDINGS: The heart size and mediastinal contours are within normal limits. Both lungs are clear. The visualized skeletal structures are unremarkable. IMPRESSION: No active cardiopulmonary disease. Electronically Signed   By: Deatra Robinson M.D.   On: 09/03/2022 21:17     PROCEDURES:  Critical Care performed: Yes, see critical care procedure note(s)  CRITICAL CARE Performed by: Irean Hong   Total critical care time: 45 minutes  Critical care time was exclusive of separately billable procedures and treating other  patients.  Critical care was necessary to treat or prevent imminent or life-threatening deterioration.  Critical care was time spent personally by me on the following activities: development of treatment plan with patient and/or surrogate as well as nursing, discussions with consultants, evaluation of patient's response to treatment, examination of patient, obtaining history from patient or surrogate, ordering and performing treatments and interventions, ordering and review of laboratory studies, ordering and review of radiographic studies, pulse oximetry and re-evaluation of patient's condition.   Marland Kitchen1-3 Lead EKG Interpretation  Performed by: Irean Hong, MD Authorized by: Irean Hong, MD     Interpretation: abnormal     ECG rate:  108   ECG rate  assessment: tachycardic     Rhythm: sinus tachycardia     Ectopy: none     Conduction: normal   Comments:     Patient placed on cardiac monitor to evaluate for arrhythmias  NIH Stroke Scale  Interval: Baseline Time: 3:39 AM Person Administering Scale: Darielys Giglia J  Administer stroke scale items in the order listed. Record performance in each category after each subscale exam. Do not go back and change scores. Follow directions provided for each exam technique. Scores should reflect what the patient does, not what the clinician thinks the patient can do. The clinician should record answers while administering the exam and work quickly. Except where indicated, the patient should not be coached (i.e., repeated requests to patient to make a special effort).   1a  Level of consciousness: 0=alert; keenly responsive  1b. LOC questions:  0=Performs both tasks correctly  1c. LOC commands: 0=Performs both tasks correctly  2.  Best Gaze: 0=normal  3.  Visual: 0=No visual loss  4. Facial Palsy: 0=Normal symmetric movement  5a.  Motor left arm: 0=No drift, limb holds 90 (or 45) degrees for full 10 seconds  5b.  Motor right arm: 0=No drift, limb holds  90 (or 45) degrees for full 10 seconds  6a. motor left leg: 1=Drift, limb holds 90 (or 45) degrees but drifts down before full 10 seconds: does not hit bed  6b  Motor right leg:  0=No drift, limb holds 90 (or 45) degrees for full 10 seconds  7. Limb Ataxia: 0=Absent  8.  Sensory: 0=Normal; no sensory loss  9. Best Language:  0=No aphasia, normal  10. Dysarthria: 0=Normal  11. Extinction and Inattention: 0=No abnormality  12. Distal motor function: 0=Normal   Total:   1     MEDICATIONS ORDERED IN ED: Medications  nitroGLYCERIN (NITROGLYN) 2 % ointment 0.5 inch (0.5 inches Topical Given 09/03/22 2333)     IMPRESSION / MDM / ASSESSMENT AND PLAN / ED COURSE  I reviewed the triage vital signs and the nursing notes.                             42 year old male presenting with chest pain. Differential diagnosis includes, but is not limited to, ACS, aortic dissection, pulmonary embolism, cardiac tamponade, pneumothorax, pneumonia, pericarditis, myocarditis, GI-related causes including esophagitis/gastritis, and musculoskeletal chest wall pain.   I personally reviewed patient's records and note mostly ED visits, most recently for right ear pain in October 2023.  Patient's presentation is most consistent with acute presentation with potential threat to life or bodily function.  The patient is on the cardiac monitor to evaluate for evidence of arrhythmia and/or significant heart rate changes.  Laboratory results demonstrate moderate leukocytosis WBC 14, unremarkable electrolytes, initial troponin and chest x-ray unremarkable.  Awaiting repeat troponin.  Will add hepatic panel and lipase.  Patient voices no complaints currently.  Will reassess.  Clinical Course as of 09/04/22 1610  Wed Sep 03, 2022  2321 Spouse now at bedside who tells me patient was dragging his left leg when they were caring him inside the house.  Patient denies acute back pain.  Spouse also tells me he had recurrent syncope at  the time.  Neuroexam demonstrates alert and oriented x 3.  CN II-XII intact.  3/5 motor strength left lower leg compared to the right with normal sensation.  Given this new information, will obtain CT head.  Patient is greater than 5.5 hours  from time of incident and thus out of the window for tPA or ED code stroke. [JS]  Thu Sep 04, 2022  0040 CT head is negative.  Will consult hospital services for evaluation and admission. [JS]  0134 Patient has decided to leave.  We discussed risks of him leaving including fatal heart attack, worsening neurological deficits secondary to probable stroke.  We discussed he may suffer permanent disability and potentially death.  Patient and wife verbalized this conversation and understand.  Will refer patient to cardiology for follow-up.  Have encouraged him to take a baby aspirin daily.  Very strict return precautions given.  Both verbalized understanding and agree with plan of care. [JS]    Clinical Course User Index [JS] Irean Hong, MD     FINAL CLINICAL IMPRESSION(S) / ED DIAGNOSES   Final diagnoses:  Chest pain, unspecified type  Weakness  Syncope, unspecified syncope type  Hypertension, unspecified type     Rx / DC Orders   ED Discharge Orders          Ordered    Ambulatory referral to Cardiology       Comments: If you have not heard from the Cardiology office within the next 72 hours please call [phone].   09/04/22 0134             Note:  This document was prepared using Dragon voice recognition software and may include unintentional dictation errors.   Irean Hong, MD 09/04/22 [phone]

## 2022-09-03 NOTE — ED Triage Notes (Signed)
Pt presents ambulatory to triage via POV with complaints of left sided CP after playing with his kids tonight outside. Denies falls or injury. He notes taking  ASA PTA without any improvement in his sx. A&Ox4 at this time. Denies fevers, chills, N/V/D, or SOB.

## 2022-09-03 NOTE — ED Provider Notes (Incomplete)
[facility] Provider Note    Event Date/Time   First MD Initiated Contact with Patient 09/03/22 2305     (approximate)   History   Chest Pain   HPI  ELAI [name] is a 42 y.o. male who presents to the ED from home with a chief complaint of chest pain.  Patient with a history of hypertension, GERD who had onset of left-sided chest heaviness after playing with his kids outside tonight.  This was around 6 PM.  EMS was called to the house and given 3 baby aspirin in addition to the 1 baby aspirin patient had already taken.  Patient currently denies chest pain.  Denies associated shortness of breath, palpitations, vomiting or dizziness.  Endorses nausea and diaphoresis but he was outdoors playing at the time in the heat.  Denies recent travel, trauma or hormone use.     Past Medical History   Past Medical History:  Diagnosis Date  . Diverticulitis   . GERD (gastroesophageal reflux disease)   . Hypertension      Active Problem List  There are no problems to display for this patient.    Past Surgical History  History reviewed. No pertinent surgical history.   Home Medications   Prior to Admission medications   Medication Sig Start Date End Date Taking? Authorizing Provider  amLODipine (NORVASC) 10 MG tablet Take 10 mg by mouth daily.    [provider]  famotidine (PEPCID) 20 MG tablet Take 1 tablet (20 mg total) by mouth 2 (two) times daily. 08/27/21 08/27/22  Menshew, Charlesetta Ivory, PA-C  fluticasone (FLONASE) 50 MCG/ACT nasal spray Place 2 sprays into both nostrils daily. 02/16/22 03/18/22  Shaune Pollack, MD  lisinopril-hydrochlorothiazide (ZESTORETIC) 20-25 MG tablet Take 1 tablet by mouth daily.    [provider]  predniSONE (DELTASONE) 10 MG tablet Take 6 tablets  today, on day 2 take 5 tablets, day 3 take 4 tablets, day 4 take 3 tablets, day 5 take  2 tablets and 1 tablet the last day 03/02/22   Tommi Rumps, PA-C   diphenhydrAMINE (BENADRYL) 25 mg capsule Take 1 capsule (25 mg total) by mouth every 4 (four) hours as needed. 11/23/16 03/12/19  Chinita Pester, FNP     Allergies  Patient has no known allergies.   Family History  History reviewed. No pertinent family history.   Physical Exam  Triage Vital Signs: ED Triage Vitals  Enc Vitals Group     BP 09/03/22 2056 (!) 141/109     Pulse Rate 09/03/22 2056 100     Resp 09/03/22 2056 18     Temp 09/03/22 2056 98.5 F (36.9 C)     Temp src --      SpO2 09/03/22 2056 98 %     Weight 09/03/22 2055 232 lb 2.3 oz (105.3 kg)     Height 09/03/22 2055 5\' 11"  (1.803 m)     Head Circumference --      Peak Flow --      Pain Score 09/03/22 2055 6     Pain Loc --      Pain Edu? --      Excl. in GC? --     Updated Vital Signs: BP 118/83   Pulse 85   Temp 98.5 F (36.9 C)   Resp 20   Ht 5\' 11"  (1.803 m)   Wt 105.3 kg   SpO2 91%   BMI 32.38 kg/m  General: Awake, no distress.  CV:  RRR.  Good peripheral perfusion.  Resp:  Normal effort.  CTAB. Abd:  Nontender.  No distention.  Other:  Left anterior chest mildly tender to palpation.   ED Results / Procedures / Treatments  Labs (all labs ordered are listed, but only abnormal results are displayed) Labs Reviewed  BASIC METABOLIC PANEL - Abnormal; Notable for the following components:      Result Value   Potassium 3.4 (*)    Glucose, Bld 100 (*)    All other components within normal limits  CBC - Abnormal; Notable for the following components:   WBC 14.2 (*)    Hemoglobin 17.2 (*)    All other components within normal limits  HEPATIC FUNCTION PANEL  LIPASE, BLOOD  TROPONIN I (HIGH SENSITIVITY)  TROPONIN I (HIGH SENSITIVITY)     EKG  ED ECG REPORT I, Elzada Pytel J, the attending physician, personally viewed and interpreted this ECG.   Date: 09/03/2022  EKG Time: 2053  Rate: 108  Rhythm: sinus tachycardia  Axis: LAD  Intervals:none  ST&T Change:  Nonspecific    RADIOLOGY I have independently visualized and interpreted patient's x-ray as well as noted the radiology interpretation:  X-ray: No acute cardiopulmonary process  Official radiology report(s): DG Chest 2 View  Result Date: 09/03/2022 CLINICAL DATA:  Chest pain EXAM: CHEST - 2 VIEW COMPARISON:  06/24/2007 FINDINGS: The heart size and mediastinal contours are within normal limits. Both lungs are clear. The visualized skeletal structures are unremarkable. IMPRESSION: No active cardiopulmonary disease. Electronically Signed   By: Deatra Robinson M.D.   On: 09/03/2022 21:17     PROCEDURES:  Critical Care performed: Yes, see critical care procedure note(s)  CRITICAL CARE Performed by: Irean Hong   Total critical care time: *** minutes  Critical care time was exclusive of separately billable procedures and treating other patients.  Critical care was necessary to treat or prevent imminent or life-threatening deterioration.  Critical care was time spent personally by me on the following activities: development of treatment plan with patient and/or surrogate as well as nursing, discussions with consultants, evaluation of patient's response to treatment, examination of patient, obtaining history from patient or surrogate, ordering and performing treatments and interventions, ordering and review of laboratory studies, ordering and review of radiographic studies, pulse oximetry and re-evaluation of patient's condition.   Marland Kitchen1-3 Lead EKG Interpretation  Performed by: Irean Hong, MD Authorized by: Irean Hong, MD     Interpretation: abnormal     ECG rate:  108   ECG rate assessment: tachycardic     Rhythm: sinus tachycardia     Ectopy: none     Conduction: normal   Comments:     Patient placed on cardiac monitor to evaluate for arrhythmias  NIH Stroke Scale  Interval: Baseline Time: 11:51 PM Person Administering Scale: Elmira Olkowski J  Administer stroke scale items in  the order listed. Record performance in each category after each subscale exam. Do not go back and change scores. Follow directions provided for each exam technique. Scores should reflect what the patient does, not what the clinician thinks the patient can do. The clinician should record answers while administering the exam and work quickly. Except where indicated, the patient should not be coached (i.e., repeated requests to patient to make a special effort).   1a  Level of consciousness: 0=alert; keenly responsive  1b. LOC questions:  0=Performs both tasks correctly  1c. LOC commands: 0=Performs both tasks correctly  2.  Best Gaze: 0=normal  3.  Visual: 0=No visual loss  4. Facial Palsy: 0=Normal symmetric movement  5a.  Motor left arm: 0=No drift, limb holds 90 (or 45) degrees for full 10 seconds  5b.  Motor right arm: 0=No drift, limb holds 90 (or 45) degrees for full 10 seconds  6a. motor left leg: 1=Drift, limb holds 90 (or 45) degrees but drifts down before full 10 seconds: does not hit bed  6b  Motor right leg:  0=No drift, limb holds 90 (or 45) degrees for full 10 seconds  7. Limb Ataxia: 0=Absent  8.  Sensory: 0=Normal; no sensory loss  9. Best Language:  0=No aphasia, normal  10. Dysarthria: 0=Normal  11. Extinction and Inattention: 0=No abnormality  12. Distal motor function: 0=Normal   Total:   1     MEDICATIONS ORDERED IN ED: Medications  nitroGLYCERIN (NITROGLYN) 2 % ointment 0.5 inch (0.5 inches Topical Given 09/03/22 2333)     IMPRESSION / MDM / ASSESSMENT AND PLAN / ED COURSE  I reviewed the triage vital signs and the nursing notes.                             42 year old male presenting with chest pain. Differential diagnosis includes, but is not limited to, ACS, aortic dissection, pulmonary embolism, cardiac tamponade, pneumothorax, pneumonia, pericarditis, myocarditis, GI-related causes including esophagitis/gastritis, and musculoskeletal chest wall pain.   I  personally reviewed patient's records and note mostly ED visits, most recently for right ear pain in October 2023.  Patient's presentation is most consistent with acute presentation with potential threat to life or bodily function.  The patient is on the cardiac monitor to evaluate for evidence of arrhythmia and/or significant heart rate changes.  Laboratory results demonstrate moderate leukocytosis WBC 14, unremarkable electrolytes, initial troponin and chest x-ray unremarkable.  Awaiting repeat troponin.  Will add hepatic panel and lipase.  Patient voices no complaints currently.  Will reassess.  Clinical Course as of 09/03/22 2351  Wed Sep 03, 2022  2321 Spouse now at bedside who tells me patient was dragging his left leg when they were caring him inside the house.  Patient denies acute back pain.  Spouse also tells me he had recurrent syncope at the time.  Neuroexam demonstrates alert and oriented x 3.  CN II-XII intact.  3/5 motor strength left lower leg compared to the right with normal sensation.  Given this new information, will obtain CT head.  Patient is greater than 5.5 hours from time of incident and thus out of the window for tPA or ED code stroke. [JS]    Clinical Course User Index [JS] Irean Hong, MD     FINAL CLINICAL IMPRESSION(S) / ED DIAGNOSES   Final diagnoses:  None     Rx / DC Orders   ED Discharge Orders     None        Note:  This document was prepared using Dragon voice recognition software and may include unintentional dictation errors.

## 2022-09-04 DIAGNOSIS — R079 Chest pain, unspecified: Secondary | ICD-10-CM | POA: Diagnosis present

## 2022-09-04 LAB — LIPASE, BLOOD: Lipase: 44 U/L (ref 11–51)

## 2022-09-04 LAB — HEPATIC FUNCTION PANEL
ALT: 33 U/L (ref 0–44)
AST: 26 U/L (ref 15–41)
Albumin: 4 g/dL (ref 3.5–5.0)
Alkaline Phosphatase: 87 U/L (ref 38–126)
Bilirubin, Direct: <0.1 mg/dL (ref 0.0–0.2)
Total Bilirubin: 0.7 mg/dL (ref 0.3–1.2)
Total Protein: 7.1 g/dL (ref 6.5–8.1)

## 2022-09-04 NOTE — ED Notes (Signed)
Family member made this EDT aware that pt is "ready to leave". MD made aware at this time.

## 2022-09-04 NOTE — Discharge Instructions (Signed)
You are leaving AGAINST MEDICAL ADVICE.  I have recommended hospitalization to further evaluate your chest pain and leg weakness which is concerning for stroke.  Please return at any time if you change your mind or experience worsening symptoms.  It would be a good idea for you to start taking a baby aspirin daily.

## 2023-03-24 ENCOUNTER — Emergency Department
Admission: EM | Admit: 2023-03-24 | Discharge: 2023-03-24 | Disposition: A | Discharge status: Home / Self Care | Payer: Self-pay | Attending: Emergency Medicine | Admitting: Emergency Medicine

## 2023-03-24 ENCOUNTER — Other Ambulatory Visit: Payer: Self-pay

## 2023-03-24 ENCOUNTER — Emergency Department: Payer: Self-pay

## 2023-03-24 ENCOUNTER — Encounter: Payer: Self-pay | Admitting: Emergency Medicine

## 2023-03-24 DIAGNOSIS — R748 Abnormal levels of other serum enzymes: Secondary | ICD-10-CM | POA: Insufficient documentation

## 2023-03-24 DIAGNOSIS — E876 Hypokalemia: Secondary | ICD-10-CM | POA: Insufficient documentation

## 2023-03-24 DIAGNOSIS — R109 Unspecified abdominal pain: Secondary | ICD-10-CM

## 2023-03-24 DIAGNOSIS — K859 Acute pancreatitis without necrosis or infection, unspecified: Secondary | ICD-10-CM | POA: Insufficient documentation

## 2023-03-24 DIAGNOSIS — I10 Essential (primary) hypertension: Secondary | ICD-10-CM | POA: Insufficient documentation

## 2023-03-24 LAB — CBC
HCT: 42.8 % (ref 39.0–52.0)
Hemoglobin: 15.3 g/dL (ref 13.0–17.0)
MCH: 32.8 pg (ref 26.0–34.0)
MCHC: 35.7 g/dL (ref 30.0–36.0)
MCV: 91.6 fL (ref 80.0–100.0)
Platelets: 338 10*3/uL (ref 150–400)
RBC: 4.67 MIL/uL (ref 4.22–5.81)
RDW: 13.2 % (ref 11.5–15.5)
WBC: 10.7 10*3/uL — ABNORMAL HIGH (ref 4.0–10.5)
nRBC: 0 % (ref 0.0–0.2)

## 2023-03-24 LAB — COMPREHENSIVE METABOLIC PANEL
ALT: 34 U/L (ref 0–44)
AST: 27 U/L (ref 15–41)
Albumin: 3.8 g/dL (ref 3.5–5.0)
Alkaline Phosphatase: 78 U/L (ref 38–126)
Anion gap: 11 (ref 5–15)
BUN: 14 mg/dL (ref 6–20)
CO2: 28 mmol/L (ref 22–32)
Calcium: 8.9 mg/dL (ref 8.9–10.3)
Chloride: 93 mmol/L — ABNORMAL LOW (ref 98–111)
Creatinine, Ser: 0.97 mg/dL (ref 0.61–1.24)
GFR, Estimated: >60 mL/min (ref >60)
Glucose, Bld: 119 mg/dL — ABNORMAL HIGH (ref 70–99)
Potassium: 2.8 mmol/L — ABNORMAL LOW (ref 3.5–5.1)
Sodium: 132 mmol/L — ABNORMAL LOW (ref 135–145)
Total Bilirubin: 0.6 mg/dL (ref <1.2)
Total Protein: 8.4 g/dL — ABNORMAL HIGH (ref 6.5–8.1)

## 2023-03-24 LAB — URINALYSIS, ROUTINE W REFLEX MICROSCOPIC
Bilirubin Urine: NEGATIVE
Glucose, UA: NEGATIVE mg/dL
Hgb urine dipstick: NEGATIVE
Ketones, ur: NEGATIVE mg/dL
Leukocytes,Ua: NEGATIVE
Nitrite: NEGATIVE
Protein, ur: NEGATIVE mg/dL
Specific Gravity, Urine: 1.018 (ref 1.005–1.030)
pH: 5 (ref 5.0–8.0)

## 2023-03-24 LAB — LIPASE, BLOOD: Lipase: 101 U/L — ABNORMAL HIGH (ref 11–51)

## 2023-03-24 MED ORDER — SODIUM CHLORIDE 0.9 % IV BOLUS
1000.0000 mL | Freq: Once | INTRAVENOUS | Status: AC
  Administered 2023-03-24: 1000 mL via INTRAVENOUS

## 2023-03-24 MED ORDER — POTASSIUM CHLORIDE CRYS ER 20 MEQ PO TBCR: 20.0000 meq | EXTENDED_RELEASE_TABLET | Freq: Two times a day (BID) | ORAL | 0 refills | Status: DC

## 2023-03-24 MED ORDER — POTASSIUM CHLORIDE CRYS ER 20 MEQ PO TBCR
40.0000 meq | EXTENDED_RELEASE_TABLET | Freq: Once | ORAL | Status: AC
  Administered 2023-03-24: 40 meq via ORAL
  Filled 2023-03-24: qty 2

## 2023-03-24 MED ORDER — IOHEXOL 300 MG/ML  SOLN
100.0000 mL | Freq: Once | INTRAMUSCULAR | Status: AC | PRN
  Administered 2023-03-24: 100 mL via INTRAVENOUS

## 2023-03-24 MED ORDER — MORPHINE SULFATE (PF) 4 MG/ML IV SOLN
4.0000 mg | Freq: Once | INTRAVENOUS | Status: DC
  Filled 2023-03-24: qty 1

## 2023-03-24 MED ORDER — ONDANSETRON HCL 4 MG/2ML IJ SOLN
4.0000 mg | Freq: Once | INTRAMUSCULAR | Status: DC
  Filled 2023-03-24: qty 2

## 2023-03-24 MED ORDER — HYDROCODONE-ACETAMINOPHEN 5-325 MG PO TABS: 1.0000 | ORAL_TABLET | ORAL | 0 refills | Status: DC | PRN

## 2023-03-24 MED ORDER — ONDANSETRON 4 MG PO TBDP: 4.0000 mg | ORAL_TABLET | Freq: Three times a day (TID) | ORAL | 0 refills | Status: DC | PRN

## 2023-03-24 NOTE — Discharge Instructions (Signed)
Please take your pain medication as needed but only as prescribed.  Do not drink alcohol.  Please follow-up with your doctor over the next couple days for recheck/reevaluation.

## 2023-03-24 NOTE — ED Provider Notes (Signed)
[facility] Provider Note    Event Date/Time   First MD Initiated Contact with Patient 03/24/23 1215     (approximate)  History   Chief Complaint: Abdominal Pain  HPI  Jim [name] is a 42 y.o. male with a past medical history of diverticulitis, gastric reflux, hypertension presents to the emergency department for left lower quadrant abdominal pain.  According to the patient last week he had an upper respiratory infection with cough congestion subjective fever.  He states his symptoms have resolved however over the last few days he is now been experiencing lower abdominal pain across the lower abdomen but mostly in the left lower quadrant.  Patient denies any dysuria although states at times it feels like he needs to urinate and not much comes out.  Denies any nausea or vomiting but does state some loose stool recently.  Physical Exam   Triage Vital Signs: ED Triage Vitals [03/24/23 1200]  Encounter Vitals Group     BP 118/82     Systolic BP Percentile      Diastolic BP Percentile      Pulse Rate 100     Resp 19     Temp 98.4 F (36.9 C)     Temp Source Oral     SpO2 98 %     Weight 230 lb (104.3 kg)     Height 5\' 9"  (1.753 m)     Head Circumference      Peak Flow      Pain Score 8     Pain Loc      Pain Education      Exclude from Growth Chart     Most recent vital signs: Vitals:   03/24/23 1200  BP: 118/82  Pulse: 100  Resp: 19  Temp: 98.4 F (36.9 C)  SpO2: 98%    General: Awake, no distress.  CV:  Good peripheral perfusion.  Regular rate and rhythm  Resp:  Normal effort.  Equal breath sounds bilaterally.  Abd:  No distention.  Soft, moderate left lower quadrant tenderness palpation without rebound or guarding.  ED Results / Procedures / Treatments    RADIOLOGY  CT pending   MEDICATIONS ORDERED IN ED: Medications  morphine (PF) 4 MG/ML injection 4 mg (has no administration in time range)  ondansetron (ZOFRAN) injection 4  mg (has no administration in time range)  sodium chloride 0.9 % bolus 1,000 mL (has no administration in time range)     IMPRESSION / MDM / ASSESSMENT AND PLAN / ED COURSE  I reviewed the triage vital signs and the nursing notes.  Patient's presentation is most consistent with acute presentation with potential threat to life or bodily function.  Patient presents the emergency department for left lower quadrant abdominal pain over the last several days.  Just recovered from an upper respiratory infection last week per patient.  Overall the patient appears well does have moderate left lower quadrant tenderness.  We will check labs including CBC chemistry lipase and urinalysis.  Given the left lower quadrant tenderness suspicious for diverticulitis or colitis we will obtain CT imaging.  Patient CBC is reassuring, chemistry shows mild hypokalemia.  Normal LFTs.  Lipase is elevated 101 possibly indicating acute pancreatitis however patient states most of his discomfort is in the lower abdomen.  Urinalysis is normal.  CT scan is pending.  Patient care signed out to oncoming provider.  FINAL CLINICAL IMPRESSION(S) / ED DIAGNOSES   Left lower quadrant pain  Acute pancreatitis    Note:  This document was prepared using Dragon voice recognition software and may include unintentional dictation errors.   Minna Antis, MD 03/28/23 [phone]

## 2023-03-24 NOTE — ED Provider Notes (Signed)
-----------------------------------------   3:13 PM on 03/24/2023 -----------------------------------------  Blood pressure 118/82, pulse 100, temperature 98.4 F (36.9 C), temperature source Oral, resp. rate 19, height 5\' 9"  (1.753 m), weight 104.3 kg, SpO2 98%.  Assuming care from Dr. Lenard Lance.  In short, Jim [name] is a 42 y.o. male with a chief complaint of Abdominal Pain .  Refer to the original H&P for additional details.  The current plan of care is to follow-up CT results for abdominal pain.  ----------------------------------------- 5:00 PM on 03/24/2023 ----------------------------------------- CT imaging is negative for acute process, shows chronic bladder wall thickening but urinalysis today with no signs of infection.  No signs of pancreatitis on CT, symptoms improved on reassessment.  Patient appropriate for discharge home with outpatient follow-up, was counseled to return to the ED for new or worsening symptoms.  Patient agrees with plan.    Chesley Noon, MD 03/24/23 1700

## 2023-03-24 NOTE — ED Notes (Signed)
Pt transported to CT ?

## 2023-03-24 NOTE — ED Triage Notes (Signed)
Pt sts that last week he had a URI and now he has been having abd pain. Pt endorses N/D for the last week.

## 2024-03-14 NOTE — Progress Notes (Unsigned)
 Cardiology Office Note   Date:  03/16/2024  ID:  Jim [name], Jim [name] 04-09-1981, MRN [id number] PCP: [facility] Providers Cardiologist:  New   History of Present Illness Jim [name] is a 43 y.o. male with a h/o CAD s/p stenting at [facility] 01/2024 (unable to see records at this time), HTN, HLD, and tobacco use who presents as a new patient.   The patient denies any alcohol use or drug use. He smokes 1/2 ppd-he is trying to quite. Mother has diabetes, no h/o heart disease.  The patient was admitted last month at [facility] for acute MI 9/25-9/27. He brought his discharge papers with him, but we are unable to see any records of this hospitalization in the EHR. He reports he had chest pain at his job and EMS was called. EMS did an EKG, and suspected EKG showed STEMI since he was taken straight to the Cath Lab. He reports he received 3 stents and transferred to the ICU, where he woke up.  He reports cath site (right radial) remained stable.  Remembers blood pressures were high post-cath. He was sent home on ASA 81 mg daily,, Brilinta 90mg BID, lisinopril 20mg  daily, Toprol 50mg  daily, Amlodipine 10mg  daily. He has since run out of medications and needs refills.  Today, the patient denies further chest pain. No shortness of breath. He is still taking it east at work. BP is better, but still a little high. He has not had medications today. He notes a bruise on the right calf. He denies other bleeding issues.  Patient says he will not likely do cardiac rehab.  Studies Reviewed EKG Interpretation Date/Time:  Wednesday March 16 2024 15:16:23 EDT Ventricular Rate:  87 PR Interval:  162 QRS Duration:  86 QT Interval:  368 QTC Calculation: 442 R Axis:   0  Text Interpretation: Normal sinus rhythm Nonspecific T wave abnormality When compared with ECG of 03-Sep-2022 20:53, Nonspecific T wave abnormality now evident in Inferior leads T wave inversion less evident  in Lateral leads Confirmed by Franchester, Beza Steppe ([id number]) on 03/16/2024 3:28:55 PM    Requesting records     Physical Exam VS:  BP (!) 150/95 (BP Location: Left Arm, Patient Position: Sitting, Cuff Size: Normal) Comment: NO TAKE MED THIS MORNING  Pulse 87 Comment: 105 oximeter  Ht 5' 10 (1.778 m)   Wt 231 lb 9.6 oz (105.1 kg)   SpO2 98%   BMI 33.23 kg/m        Wt Readings from Last 3 Encounters:  03/16/24 231 lb 9.6 oz (105.1 kg)  03/24/23 230 lb (104.3 kg)  09/03/22 232 lb 2.3 oz (105.3 kg)    GEN: Well nourished, well developed in no acute distress NECK: No JVD; No carotid bruits CARDIAC: RRR, no murmurs, rubs, gallops RESPIRATORY:  Clear to auscultation without rales, wheezing or rhonchi  ABDOMEN: Soft, non-tender, non-distended EXTREMITIES:  No edema; No deformity   ASSESSMENT AND PLAN  CAD s/p recent MI with stenting Patient was recently admitted at [facility] in September 2025 for suspected STEMI treated with 3 stents, per patient report.  He brought his discharge paperwork with date of admission and medications.  Unfortunately, we are unable to see any records in his chart- I will request records.  Since discharge, the patient has been doing well.  He is back at work.  Work is very physical and he walks all day.  He denies any chest pain or shortness of breath.  Cath site, right radial, has healed well.  I will send in a cardiac rehab referral, although he says he is not likely interested.  He has run out of his medications, so these will all be sent in.  He will continue aspirin 81 mg daily, Brilinta 90 mg twice daily, amlodipine 10 mg daily, lisinopril 20 mg daily, Toprol 50 mg daily, Lipitor 80 mg daily.  I suspect DAPT will be for at least 12 months.  I will update a CBC and a c-Met today.  We will see him back in 3 months.  Right calf hematoma Patient is unclear if he had an injury.  I will check an US .  HLD LDL 124.  I will re-check lipids at follow-up. Continue Lipitor 80mg   daily.   HTN BP is elevated today, but he ran out of medications. I will send in amlodipine 10 mg daily, lisinopril 20 mg daily, Toprol 50 mg daily.  Tobacco use Patient is down 2 packs a day to half pack per day.  He was congratulated.  Complete cessation recommended.    Cardiac Rehabilitation Eligibility Assessment  The patient is ready to start cardiac rehabilitation from a cardiac standpoint.       Dispo: Follow-up in 3 months  Signed, Shayra Anton VEAR Fishman, PA-C

## 2024-03-16 ENCOUNTER — Ambulatory Visit: Payer: Self-pay | Attending: Medical | Admitting: Medical

## 2024-03-16 ENCOUNTER — Encounter: Payer: Self-pay | Admitting: Medical

## 2024-03-16 VITALS — BP 150/95 | HR 87 | Ht 70.0 in | Wt 231.6 lb

## 2024-03-16 DIAGNOSIS — E782 Mixed hyperlipidemia: Secondary | ICD-10-CM | POA: Diagnosis not present

## 2024-03-16 DIAGNOSIS — T148XXA Other injury of unspecified body region, initial encounter: Secondary | ICD-10-CM | POA: Diagnosis not present

## 2024-03-16 DIAGNOSIS — I2510 Atherosclerotic heart disease of native coronary artery without angina pectoris: Secondary | ICD-10-CM

## 2024-03-16 DIAGNOSIS — I10 Essential (primary) hypertension: Secondary | ICD-10-CM | POA: Diagnosis not present

## 2024-03-16 DIAGNOSIS — Z720 Tobacco use: Secondary | ICD-10-CM

## 2024-03-16 MED ORDER — LISINOPRIL 20 MG PO TABS: 20.0000 mg | ORAL_TABLET | Freq: Every day | ORAL | 3 refills | Status: DC

## 2024-03-16 MED ORDER — ASPIRIN 81 MG PO TBEC: 81.0000 mg | DELAYED_RELEASE_TABLET | Freq: Every day | ORAL | Status: AC

## 2024-03-16 MED ORDER — METOPROLOL SUCCINATE ER 50 MG PO TB24: 50.0000 mg | ORAL_TABLET | Freq: Every day | ORAL | 3 refills | Status: AC

## 2024-03-16 MED ORDER — ATORVASTATIN CALCIUM 80 MG PO TABS: 80.0000 mg | ORAL_TABLET | Freq: Every day | ORAL | 3 refills | Status: AC

## 2024-03-16 MED ORDER — PANTOPRAZOLE SODIUM 40 MG PO TBEC: 40.0000 mg | DELAYED_RELEASE_TABLET | Freq: Every day | ORAL | 3 refills | Status: AC

## 2024-03-16 MED ORDER — AMLODIPINE BESYLATE 10 MG PO TABS: 10.0000 mg | ORAL_TABLET | Freq: Every day | ORAL | 3 refills | Status: AC

## 2024-03-16 MED ORDER — BRILINTA 90 MG PO TABS: 90.0000 mg | ORAL_TABLET | Freq: Two times a day (BID) | ORAL | 3 refills | Status: DC

## 2024-03-16 NOTE — Patient Instructions (Signed)
 Referral to Cardiac Rehab  Medication Instructions:  Your physician recommends the following medication changes.  STOP TAKING: Lisinopril-HCTZ  START TAKING: Protonix 40 mg daily Lisinopril 20 mg daily  *If you need a refill on your cardiac medications before your next appointment, please call your pharmacy*  Lab Work: Your provider would like for you to have following labs drawn today CBC, CMP.   If you have labs (blood work) drawn today and your tests are completely normal, you will receive your results only by: MyChart Message (if you have MyChart) OR A paper copy in the mail If you have any lab test that is abnormal or we need to change your treatment, we will call you to review the results.  Testing/Procedures: No test ordered today   Follow-Up: At [facility], you and your health needs are our priority.  As part of our continuing mission to provide you with exceptional heart care, our providers are all part of one team.  This team includes your primary Cardiologist (physician) and Advanced Practice Providers or APPs (Physician Assistants and Nurse Practitioners) who all work together to provide you with the care you need, when you need it.  Your next appointment:   3 month(s)  Provider:   You will see one of the following Advanced Practice Providers on your designated Care Team:   Cadence Franchester, [place]      We recommend signing up for the patient portal called MyChart.  Sign up information is provided on this After Visit Summary.  MyChart is used to connect with patients for Virtual Visits (Telemedicine).  Patients are able to view lab/test results, encounter notes, upcoming appointments, etc.  Non-urgent messages can be sent to your provider as well.   To learn more about what you can do with MyChart, go to [email or web address].

## 2024-03-16 NOTE — Progress Notes (Signed)
 Release of Information form completed by patient for medical records from [facility] 02/11/24 to current

## 2024-03-17 ENCOUNTER — Telehealth: Payer: Self-pay | Admitting: Medical

## 2024-03-17 LAB — COMPREHENSIVE METABOLIC PANEL WITH GFR
ALT: 69 IU/L — ABNORMAL HIGH (ref 0–44)
AST: 36 IU/L (ref 0–40)
Albumin: 4.4 g/dL (ref 4.1–5.1)
Alkaline Phosphatase: 124 IU/L — ABNORMAL HIGH (ref 47–123)
BUN/Creatinine Ratio: 10 (ref 9–20)
BUN: 10 mg/dL (ref 6–24)
Bilirubin Total: 0.5 mg/dL (ref 0.0–1.2)
CO2: 22 mmol/L (ref 20–29)
Calcium: 9.5 mg/dL (ref 8.7–10.2)
Chloride: 101 mmol/L (ref 96–106)
Creatinine, Ser: 1.02 mg/dL (ref 0.76–1.27)
Globulin, Total: 2.8 g/dL (ref 1.5–4.5)
Glucose: 75 mg/dL (ref 70–99)
Potassium: 4.2 mmol/L (ref 3.5–5.2)
Sodium: 140 mmol/L (ref 134–144)
Total Protein: 7.2 g/dL (ref 6.0–8.5)
eGFR: 94 mL/min/1.73 (ref >59)

## 2024-03-17 LAB — CBC
Hematocrit: 48.3 % (ref 37.5–51.0)
Hemoglobin: 16.2 g/dL (ref 13.0–17.7)
MCH: 31.8 pg (ref 26.6–33.0)
MCHC: 33.5 g/dL (ref 31.5–35.7)
MCV: 95 fL (ref 79–97)
Platelets: 246 x10E3/uL (ref 150–450)
RBC: 5.1 x10E6/uL (ref 4.14–5.80)
RDW: 13 % (ref 11.6–15.4)
WBC: 10.7 x10E3/uL (ref 3.4–10.8)

## 2024-03-17 NOTE — Telephone Encounter (Signed)
 Patient does not want to drive to [facility], will wait for schedule in [place] to open up.

## 2024-03-17 NOTE — Telephone Encounter (Signed)
-----   Message from Nurse Jon R sent at 03/16/2024  4:15 PM EDT ----- Regarding: DVT US  Patient has left already - please call to schedule DVT US  R LE

## 2024-03-18 ENCOUNTER — Ambulatory Visit: Payer: Self-pay | Admitting: Medical

## 2024-04-19 ENCOUNTER — Ambulatory Visit: Attending: Medical

## 2024-04-19 ENCOUNTER — Telehealth: Payer: Self-pay | Admitting: Medical

## 2024-04-19 DIAGNOSIS — T148XXA Other injury of unspecified body region, initial encounter: Secondary | ICD-10-CM

## 2024-04-19 NOTE — Telephone Encounter (Signed)
 Patient came by office  States that he cannot afford Brilinita and would like to know if there is any assistance  Please call to discuss

## 2024-04-20 ENCOUNTER — Other Ambulatory Visit (HOSPITAL_COMMUNITY): Payer: Self-pay

## 2024-04-20 ENCOUNTER — Telehealth: Payer: Self-pay | Admitting: Pharmacy Technician

## 2024-04-20 MED ORDER — TICAGRELOR 90 MG PO TABS: 90.0000 mg | ORAL_TABLET | Freq: Two times a day (BID) | ORAL | 11 refills | Status: AC

## 2024-04-20 NOTE — Telephone Encounter (Signed)
 I called his pharmacy and gave them his insurance and now 30.00

## 2024-04-20 NOTE — Telephone Encounter (Signed)
 Per Cadence, ok for pt to take generic medication.

## 2024-04-20 NOTE — Telephone Encounter (Signed)
   Sent message to see if he can have generic  431.31 ON INSURANCE FOR 30 DAYS FOR BRAND 30.00 ON INSURANCE FOR 30 DAYS FOR GENERIC   BUT COUPON DID NOT COVER ANYTHING

## 2024-06-14 ENCOUNTER — Encounter: Payer: Self-pay | Admitting: Medical

## 2024-06-14 ENCOUNTER — Ambulatory Visit: Attending: Medical | Admitting: Medical

## 2024-06-14 ENCOUNTER — Ambulatory Visit: Admitting: Medical

## 2024-06-14 VITALS — BP 140/92 | HR 96 | Ht 70.0 in | Wt 247.4 lb

## 2024-06-14 DIAGNOSIS — E782 Mixed hyperlipidemia: Secondary | ICD-10-CM

## 2024-06-14 DIAGNOSIS — Z79899 Other long term (current) drug therapy: Secondary | ICD-10-CM

## 2024-06-14 DIAGNOSIS — I2510 Atherosclerotic heart disease of native coronary artery without angina pectoris: Secondary | ICD-10-CM | POA: Diagnosis not present

## 2024-06-14 DIAGNOSIS — I10 Essential (primary) hypertension: Secondary | ICD-10-CM

## 2024-06-14 DIAGNOSIS — Z720 Tobacco use: Secondary | ICD-10-CM | POA: Diagnosis not present

## 2024-06-14 MED ORDER — LISINOPRIL 40 MG PO TABS: 40.0000 mg | ORAL_TABLET | Freq: Every day | ORAL | 3 refills | Status: AC

## 2024-06-14 NOTE — Patient Instructions (Signed)
 Medication Instructions:  Your physician recommends the following medication changes.  INCREASE: Lisinopril  to 40 mg by mouth daily    *If you need a refill on your cardiac medications before your next appointment, please call your pharmacy*  Lab Work: Your provider would like for you to have following labs drawn today Lipid, LFT.     Testing/Procedures: No labs ordered today   Follow-Up: At [facility], you and your health needs are our priority.  As part of our continuing mission to provide you with exceptional heart care, our providers are all part of one team.  This team includes your primary Cardiologist (physician) and Advanced Practice Providers or APPs (Physician Assistants and Nurse Practitioners) who all work together to provide you with the care you need, when you need it.  Your next appointment:   3 month(s)  Provider:   Mikey Fishman, PA-C

## 2024-06-14 NOTE — Progress Notes (Unsigned)
" °  Cardiology Office Note   Date:  06/14/2024  ID:  Pradeep, Beaubrun Apr 11, 1981, MRN [id number] PCP: [facility] Providers Cardiologist:  None { Click to update primary MD,subspecialty MD or APP then REFRESH:1}    History of Present Illness Jim [name] is a 44 y.o. male with a h/o CAD s/p stenting at [facility] 01/2024 (unable to see records at this time), HTN, HLD, and tobacco use who presents as a new patient.    The patient denies any alcohol use or drug use. He smokes 1/2 ppd-he is trying to quite. Mother has diabetes, no h/o heart disease.   The patient was admitted last month at [facility] for acute MI 9/25-9/27. He brought his discharge papers with him, but we are unable to see any records of this hospitalization in the EHR. He reports he had chest pain at his job and EMS was called. EMS did an EKG, and suspected EKG showed STEMI since he was taken straight to the Cath Lab. He reports he received 3 stents and transferred to the ICU, where he woke up.  He reports cath site (right radial) remained stable.  Remembers blood pressures were high post-cath. He was sent home on ASA 81 mg daily, Brilinta  90mg BID, lisinopril  20mg  daily, Toprol  50mg  daily, Amlodipine  10mg  daily.  The patient was last seen 03/16/24 and was stable from a cardiac perspective. He ran out of medications, so these were refilled. He reported right calf hematoma so an US  was ordered.   today  ROS: ***  Studies Reviewed      *** Risk Assessment/Calculations {Does this patient have ATRIAL FIBRILLATION?[phone]} No BP recorded.  {Refresh Note OR Click here to enter BP  :1}***       Physical Exam VS:  There were no vitals taken for this visit.       Wt Readings from Last 3 Encounters:  03/16/24 231 lb 9.6 oz (105.1 kg)  03/24/23 230 lb (104.3 kg)  09/03/22 232 lb 2.3 oz (105.3 kg)    GEN: Well nourished, well developed in no acute distress NECK: No JVD; No carotid  bruits CARDIAC: ***RRR, no murmurs, rubs, gallops RESPIRATORY:  Clear to auscultation without rales, wheezing or rhonchi  ABDOMEN: Soft, non-tender, non-distended EXTREMITIES:  No edema; No deformity   ASSESSMENT AND PLAN ***    {Are you ordering a CV Procedure (e.g. stress test, cath, DCCV, TEE, etc)?   Press F2        :[id number]}  Dispo: ***  Signed, Gailene Youkhana VEAR Fishman, PA-C   "

## 2024-06-14 NOTE — Progress Notes (Signed)
 " Cardiology Office Note   Date:  06/14/2024  ID:  Chevelle, Durr Mar 02, 1981, MRN [id number] PCP: [facility] Providers Cardiologist:  None   History of Present Illness Jim [name] is a 44 y.o. male with a h/o CAD s/p stenting at [facility] 01/2024 with PCI x 3 to the RCA ( 2 overlapping stents) and PDA (stenting x 1), HTN, HLD, and tobacco use who presents for follow-up of CAD.    The patient was admitted last month at [facility] for acute STEMI 9/25-9/27. He had chest pain at his job and EMS was called. EMS did an EKG, which showed STEMI and he was taken straight to the Cath Lab. Cath showed 90% p-m RCA lesion and 80% stenosis in the PDA. PDA was treated with DES x1 and the p-mRCA was treated with 2 overlapping stents (reports below). Echo showed LVEF>55% and no significant valve disease. He was sent home on ASA 81 mg daily, Brilinta  90mg BID, lisinopril  20mg  daily, Toprol  50mg  daily, Amlodipine  10mg  daily.   The patient was last seen 03/16/24 and was stable from a cardiac perspective. He ran out of medications, so these were refilled. He reported right calf hematoma so an US  was ordered, which was unremarkable.  Today, the patient is overall doing well. He has been taking all his medications. He did miss a dose of DAPT last night. He was out working in the snow all weekend. He denies chest pain, SOB, lower leg edema, palpitations, lightheadedness, dizziness. He is still smoking a half pack per day.  Studies Reviewed      Cath 01/2024  1. The left main appears widely patent with no significant disease, trifurcates into large LAD, small ramus branch and medium sized circumflex marginal system.  There is diffuse mild to moderate disease within the left system with subtotal occlusion of  the ramus branch which appears to be chronic, vessel fills via right to left collaterals.  The RCA is a very large, ectatic vessel which serves a very large area of  myocardium.  Within the proximal to mid segment there is a complex 90% lesion,  angiographic appearance consistent with ulcerated plaque.  Within the PDA there is a relatively focal 80% stenosis.  2. LVEDP is elevated at 25 mmHg (post PCI).  3. Successful PCI.  The RCA and PDA appear to be the culprit lesions for the patient's presentation given the angiographic appearance and ECG abnormalities.  The patient had received aspirin  and ticagrelor  load prior to arrival in the Cath Lab.  He was  given heparin in the Cath Lab and ACT's were monitored.  The RCA was engaged with a 6 French JR4 guide catheter and a workhorse wire placed into the distal PDA.  The PDA lesion was then dilated with balloon angioplasty and stented with 1 drug-eluting  stent with nice angiographic result.  Using intravascular ultrasound for sizing, the mid to proximal RCA was then treated with balloon angioplasty and then stented with 2 overlapping large drug-eluting stents which were then postdilated at high pressures   with a 5.0 mm balloon.  The patient had some transient distal no reflow and bradycardia likely related to distal embolization from debris in the mid RCA lesion.  He was given Integrilin double bolus and infusion and intracoronary nitroglycerin  with  restoration of TIMI-3 flow.  No specific intervention was needed for the bradycardia which was transient and self-limited.  No immediate complications were evident.  4. Right  radial sheath removed in lab, TR band placed  5. Moderate sedation with IV fentanyl and Versed for greater than 20 minutes was personally supervised by me with no sedation issues    Recommendations:   1. Routine post PCI care, patient transferred to the CCU postprocedure  2. Dual antiplatelet therapy for at least 1 year, single antiplatelet therapy thereafter  3. Medical therapy and aggressive risk factor modification  4. Echocardiogram to assess LV function.  Patient may need diuretics given his  elevated LVEDP  5. Risk factor modification imperative particularly tobacco cessation, discussed with patient and wife  6. Referral to outpatient cardiac rehab  7. Further CV recs per cardiology rounding service  8. Discussions with CCU staff and patient's wife are appreciated   Echo 01/2024 CONCLUSION -------------------------------------------------------------------------------  NORMAL LEFT VENTRICULAR SYSTOLIC FUNCTION WITH MILD LVH  ESTIMATED EF: >55%, CALC EF(3D): 60%  NORMAL LA PRESSURES WITH NORMAL DIASTOLIC FUNCTION  NORMAL RIGHT VENTRICULAR SYSTOLIC FUNCTION  NO VALVULAR REGURGITATION  NO VALVULAR STENOSIS   Physical Exam VS:  BP (!) 140/92 (BP Location: Left Arm, Patient Position: Sitting, Cuff Size: Normal)   Pulse 96   Ht 5' 10 (1.778 m)   Wt 247 lb 6 oz (112.2 kg)   SpO2 95%   BMI 35.49 kg/m        Wt Readings from Last 3 Encounters:  06/14/24 247 lb 6 oz (112.2 kg)  03/16/24 231 lb 9.6 oz (105.1 kg)  03/24/23 230 lb (104.3 kg)    GEN: Well nourished, well developed in no acute distress NECK: No JVD; No carotid bruits CARDIAC: RRR, no murmurs, rubs, gallops RESPIRATORY:  Clear to auscultation without rales, wheezing or rhonchi  ABDOMEN: Soft, non-tender, non-distended EXTREMITIES:  No edema; No deformity   ASSESSMENT AND PLAN  CAD s/p DES/PCI x 3 (RCA and PDA) treated at [facility] STEMI The patient denies anginal symptoms. Job is very physical and he has no exertional symptoms. He admits to missing DAPT last night, but otherwise he is compliant. It does appear he has refills on all his medications. Continue DAPT with ASA and Brilnta x 1 year. Continue Lipitor, Lisinopril , Toprol , and amlodipine .   HLD I will update lipids and LFTs today. Continue Lipitor 80mg  daily.   HTN BP is mildly elevated. I will increase lisinopril  to 40mg  daily. Continue amlodipine  and Toprol .   Tobacco use He is smoking 1/2 pack a day. Complete cessation recommended.        Dispo:  follow-up in 3 months  Signed, William Schake VEAR Fishman, PA-C   "

## 2024-06-15 LAB — HEPATIC FUNCTION PANEL
ALT: 40 [IU]/L (ref 0–44)
AST: 23 [IU]/L (ref 0–40)
Albumin: 4.3 g/dL (ref 4.1–5.1)
Alkaline Phosphatase: 124 [IU]/L — ABNORMAL HIGH (ref 47–123)
Bilirubin Total: 0.3 mg/dL (ref 0.0–1.2)
Bilirubin, Direct: 0.1 mg/dL (ref 0.00–0.40)
Total Protein: 7 g/dL (ref 6.0–8.5)

## 2024-06-15 LAB — LIPID PANEL
Chol/HDL Ratio: 4 ratio (ref 0.0–5.0)
Cholesterol, Total: 141 mg/dL (ref 100–199)
HDL: 35 mg/dL — ABNORMAL LOW
LDL Chol Calc (NIH): 89 mg/dL (ref 0–99)
Triglycerides: 90 mg/dL (ref 0–149)
VLDL Cholesterol Cal: 17 mg/dL (ref 5–40)

## 2024-06-16 ENCOUNTER — Ambulatory Visit: Payer: Self-pay | Admitting: Medical

## 2024-06-22 MED ORDER — EZETIMIBE 10 MG PO TABS: 10.0000 mg | ORAL_TABLET | Freq: Every day | ORAL | 3 refills | Status: AC

## 2024-09-15 ENCOUNTER — Ambulatory Visit: Admitting: Medical
